# Patient Record
Sex: Male | Born: 1955
Health system: Southern US, Community
[De-identification: ages and names within clinical notes are randomized; demographics above are authoritative.]

## PROBLEM LIST (undated history)

## (undated) ENCOUNTER — Emergency Department (HOSPITAL_COMMUNITY): Discharge status: Against Medical Advice | Payer: Self-pay

## (undated) DIAGNOSIS — E119 Type 2 diabetes mellitus without complications: Secondary | ICD-10-CM

## (undated) DIAGNOSIS — I639 Cerebral infarction, unspecified: Secondary | ICD-10-CM

## (undated) DIAGNOSIS — E785 Hyperlipidemia, unspecified: Secondary | ICD-10-CM

## (undated) DIAGNOSIS — I10 Essential (primary) hypertension: Secondary | ICD-10-CM

## (undated) HISTORY — PX: OTHER SURGICAL HISTORY: SHX169

---

## 2013-01-05 ENCOUNTER — Observation Stay (HOSPITAL_COMMUNITY)
Admission: EM | Admit: 2013-01-05 | Discharge: 2013-01-06 | Disposition: A | Discharge status: Home / Self Care | Payer: Self-pay | Attending: Internal Medicine | Admitting: Internal Medicine

## 2013-01-05 ENCOUNTER — Encounter (HOSPITAL_COMMUNITY): Payer: Self-pay | Admitting: *Deleted

## 2013-01-05 ENCOUNTER — Observation Stay (HOSPITAL_COMMUNITY): Payer: Self-pay

## 2013-01-05 ENCOUNTER — Emergency Department (HOSPITAL_COMMUNITY): Payer: Self-pay

## 2013-01-05 DIAGNOSIS — Z720 Tobacco use: Secondary | ICD-10-CM | POA: Diagnosis present

## 2013-01-05 DIAGNOSIS — E785 Hyperlipidemia, unspecified: Secondary | ICD-10-CM | POA: Insufficient documentation

## 2013-01-05 DIAGNOSIS — Z9119 Patient's noncompliance with other medical treatment and regimen: Secondary | ICD-10-CM | POA: Insufficient documentation

## 2013-01-05 DIAGNOSIS — F17200 Nicotine dependence, unspecified, uncomplicated: Secondary | ICD-10-CM | POA: Insufficient documentation

## 2013-01-05 DIAGNOSIS — E119 Type 2 diabetes mellitus without complications: Secondary | ICD-10-CM | POA: Diagnosis present

## 2013-01-05 DIAGNOSIS — Z8673 Personal history of transient ischemic attack (TIA), and cerebral infarction without residual deficits: Secondary | ICD-10-CM | POA: Insufficient documentation

## 2013-01-05 DIAGNOSIS — R7309 Other abnormal glucose: Secondary | ICD-10-CM | POA: Insufficient documentation

## 2013-01-05 DIAGNOSIS — I10 Essential (primary) hypertension: Secondary | ICD-10-CM | POA: Diagnosis present

## 2013-01-05 DIAGNOSIS — Z91199 Patient's noncompliance with other medical treatment and regimen due to unspecified reason: Secondary | ICD-10-CM | POA: Insufficient documentation

## 2013-01-05 DIAGNOSIS — R29898 Other symptoms and signs involving the musculoskeletal system: Secondary | ICD-10-CM | POA: Insufficient documentation

## 2013-01-05 DIAGNOSIS — R42 Dizziness and giddiness: Secondary | ICD-10-CM | POA: Insufficient documentation

## 2013-01-05 DIAGNOSIS — G459 Transient cerebral ischemic attack, unspecified: Principal | ICD-10-CM | POA: Diagnosis present

## 2013-01-05 DIAGNOSIS — R209 Unspecified disturbances of skin sensation: Secondary | ICD-10-CM | POA: Insufficient documentation

## 2013-01-05 DIAGNOSIS — R4789 Other speech disturbances: Secondary | ICD-10-CM | POA: Insufficient documentation

## 2013-01-05 HISTORY — DX: Type 2 diabetes mellitus without complications: E11.9

## 2013-01-05 HISTORY — DX: Cerebral infarction, unspecified: I63.9

## 2013-01-05 HISTORY — DX: Essential (primary) hypertension: I10

## 2013-01-05 HISTORY — DX: Hyperlipidemia, unspecified: E78.5

## 2013-01-05 LAB — POCT I-STAT, CHEM 8
BUN: 11 mg/dL (ref 6–23)
Chloride: 107 mEq/L (ref 96–112)
Creatinine, Ser: 1 mg/dL (ref 0.50–1.35)
Glucose, Bld: 93 mg/dL (ref 70–99)
Potassium: 3.5 mEq/L (ref 3.5–5.1)
Sodium: 141 mEq/L (ref 135–145)

## 2013-01-05 LAB — POCT I-STAT TROPONIN I

## 2013-01-05 LAB — RAPID URINE DRUG SCREEN, HOSP PERFORMED
Barbiturates: NOT DETECTED
Cocaine: NOT DETECTED
Opiates: NOT DETECTED
Tetrahydrocannabinol: NOT DETECTED

## 2013-01-05 LAB — URINALYSIS, ROUTINE W REFLEX MICROSCOPIC
Bilirubin Urine: NEGATIVE
Hgb urine dipstick: NEGATIVE
Nitrite: NEGATIVE
Specific Gravity, Urine: 1.007 (ref 1.005–1.030)
Urobilinogen, UA: 0.2 mg/dL (ref 0.0–1.0)
pH: 7.5 (ref 5.0–8.0)

## 2013-01-05 LAB — CBC
HCT: 40.7 % (ref 39.0–52.0)
Hemoglobin: 14.7 g/dL (ref 13.0–17.0)
MCV: 85.1 fL (ref 78.0–100.0)
RBC: 4.78 MIL/uL (ref 4.22–5.81)
RDW: 13.5 % (ref 11.5–15.5)
WBC: 4.8 10*3/uL (ref 4.0–10.5)

## 2013-01-05 LAB — COMPREHENSIVE METABOLIC PANEL
AST: 22 U/L (ref 0–37)
CO2: 26 mEq/L (ref 19–32)
Calcium: 9.2 mg/dL (ref 8.4–10.5)
Chloride: 103 mEq/L (ref 96–112)
Creatinine, Ser: 0.97 mg/dL (ref 0.50–1.35)
GFR calc Af Amer: >90 mL/min (ref >90)
GFR calc non Af Amer: >90 mL/min (ref >90)
Glucose, Bld: 93 mg/dL (ref 70–99)
Total Bilirubin: 0.2 mg/dL — ABNORMAL LOW (ref 0.3–1.2)

## 2013-01-05 LAB — DIFFERENTIAL
Eosinophils Relative: 2 % (ref 0–5)
Lymphocytes Relative: 32 % (ref 12–46)
Lymphs Abs: 1.6 10*3/uL (ref 0.7–4.0)
Monocytes Absolute: 0.4 10*3/uL (ref 0.1–1.0)
Monocytes Relative: 9 % (ref 3–12)
Neutro Abs: 2.7 10*3/uL (ref 1.7–7.7)

## 2013-01-05 LAB — GLUCOSE, CAPILLARY: Glucose-Capillary: 98 mg/dL (ref 70–99)

## 2013-01-05 LAB — APTT: aPTT: 30 seconds (ref 24–37)

## 2013-01-05 LAB — ETHANOL: Alcohol, Ethyl (B): <11 mg/dL (ref 0–11)

## 2013-01-05 MED ORDER — ASPIRIN 325 MG PO TABS
325.0000 mg | ORAL_TABLET | Freq: Once | ORAL | Status: AC
  Administered 2013-01-05: 325 mg via ORAL
  Filled 2013-01-05: qty 1

## 2013-01-05 MED ORDER — ATORVASTATIN CALCIUM 80 MG PO TABS
80.0000 mg | ORAL_TABLET | Freq: Every day | ORAL | Status: DC
  Administered 2013-01-05: 80 mg via ORAL
  Filled 2013-01-05 (×2): qty 1

## 2013-01-05 MED ORDER — CLONIDINE HCL 0.3 MG PO TABS
0.3000 mg | ORAL_TABLET | Freq: Two times a day (BID) | ORAL | Status: DC
  Administered 2013-01-05: 0.3 mg via ORAL
  Filled 2013-01-05 (×3): qty 1

## 2013-01-05 MED ORDER — ASPIRIN 325 MG PO TABS
325.0000 mg | ORAL_TABLET | Freq: Every day | ORAL | Status: DC
  Filled 2013-01-05: qty 1

## 2013-01-05 MED ORDER — LORAZEPAM 2 MG/ML IJ SOLN
1.0000 mg | Freq: Once | INTRAMUSCULAR | Status: AC
  Administered 2013-01-05: 1 mg via INTRAVENOUS
  Filled 2013-01-05: qty 1

## 2013-01-05 NOTE — Consult Note (Signed)
Referring Physician: Dr. Bernette Mayers    Chief Complaint: slurred speech  HPI: Benjamin Clark is an 57 y.o. male who began having transient dizziness around 1100 on 01/05/2013. He talked with a co-worker who took him to the work nurse and BP was 180's/120s on multiple times. Patient had slurred speech and left hand tingling. Nurse told him to come to ED. Co-worker brought him. I talked to other staff at his job who were with him during the event to confirm the timing.  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 He says that he had no residuals. He has lived in various countries, is multi-lingual so he does have an accent but he does state that he feels that his speech is NOT normal.  He used to take aspirin but stopped years ago because he says that it irritated his stomach. He denies having an EGD. He thinks that it made him bleed. No bloody vomit, but he thought it was in his stool. He describes doing hemoccult cards and them being positive back then. At this time, he does not describe any type of workup.  Date last known well: 01/05/2013 Time last known well: 1130 NIHSS=2 (dysarthria, left hand)  tPA Given: No: symptoms minimal, felt resolving  Past Medical History  Diagnosis Date  . Hypertension   . Diabetes mellitus without complication   . Stroke     History reviewed. No pertinent past surgical history.  No family history on file. Social History:  reports that he has been smoking.  He does not have any smokeless tobacco history on file. He reports that  drinks alcohol. He reports that he does not use illicit drugs.  Allergies: No Known Allergies  Meds: he does take clonidine 0.3 mg daily, ace. He does not take aspirin   ROS: History obtained from the patient  General ROS: negative for - chills, fatigue, fever, night sweats, weight gain or weight loss Psychological ROS: negative for - behavioral disorder, hallucinations, memory difficulties, mood swings or  suicidal ideation Ophthalmic ROS: negative for - blurry vision, double vision, eye pain or loss of vision ENT ROS: negative for - epistaxis, nasal discharge, oral lesions, sore throat, tinnitus or vertigo Allergy and Immunology ROS: negative for - hives or itchy/watery eyes Hematological and Lymphatic ROS: negative for - bleeding problems, bruising or swollen lymph nodes Endocrine ROS: negative for - galactorrhea, hair pattern changes, polydipsia/polyuria or temperature intolerance Respiratory ROS: negative for - cough, hemoptysis, shortness of breath or wheezing Cardiovascular ROS: negative for - chest pain, dyspnea on exertion, edema or irregular heartbeat Gastrointestinal ROS: negative for - abdominal pain, diarrhea, hematemesis, nausea/vomiting or stool incontinence Genito-Urinary ROS: negative for - dysuria, hematuria, incontinence or urinary frequency/urgency Musculoskeletal ROS: negative for - joint swelling or muscular weakness Neurological ROS: as noted in HPI Dermatological ROS: negative for rash and skin lesion changes   Physical Examination: Blood pressure 161/101, pulse 65, temperature 98.4 F (36.9 C), temperature source Oral, resp. rate 18, SpO2 99.00%.  Neurologic Examination: Mental Status: Alert, oriented, thought content appropriate.  Speech fluent without evidence of aphasia but some dysarthria (french accent).  Able to follow 3 step commands without difficulty. Cranial Nerves: II: visual fields grossly normal, pupils equal, round, reactive to light and accommodation III,IV, VI: ptosis not present, extra-ocular motions intact bilaterally V,VII: smile symmetric, facial light touch sensation normal bilaterally. He does appear to have a slight left facial droop, but this does disappear with smile. VIII: hearing normal  bilaterally IX,X: gag reflex present XI: trapezius strength/neck flexion strength normal bilaterally XII: tongue strength normal  Motor: Right : Upper  extremity   5/5    Left:     Upper extremity   5/5  Lower extremity   5/5     Lower extremity   5/5 Tone and bulk:normal tone throughout; no atrophy noted Sensory: Pinprick and light touch intact throughout, bilaterally with exception of tingling sensation of left hand Plantars: Right: downgoing   Left: downgoing Cerebellar: normal heel-to-shin test, gait not tested due to patient safety.   Results for orders placed during the hospital encounter of 01/05/13 (from the past 48 hour(s))  GLUCOSE, CAPILLARY     Status: None   Collection Time    01/05/13  1:16 PM      Result Value Range   Glucose-Capillary 98  70 - 99 mg/dL   Comment 1 Documented in Chart     Comment 2 Notify RN    PROTIME-INR     Status: None   Collection Time    01/05/13  1:40 PM      Result Value Range   Prothrombin Time 14.9  11.6 - 15.2 seconds   INR 1.19  0.00 - 1.49  APTT     Status: None   Collection Time    01/05/13  1:40 PM      Result Value Range   aPTT 30  24 - 37 seconds  CBC     Status: Abnormal   Collection Time    01/05/13  1:40 PM      Result Value Range   WBC 4.8  4.0 - 10.5 K/uL   RBC 4.78  4.22 - 5.81 MIL/uL   Hemoglobin 14.7  13.0 - 17.0 g/dL   HCT 16.1  09.6 - 04.5 %   MCV 85.1  78.0 - 100.0 fL   MCH 30.8  26.0 - 34.0 pg   MCHC 36.1 (*) 30.0 - 36.0 g/dL   RDW 40.9  81.1 - 91.4 %   Platelets 255  150 - 400 K/uL  DIFFERENTIAL     Status: None   Collection Time    01/05/13  1:40 PM      Result Value Range   Neutrophils Relative 56  43 - 77 %   Neutro Abs 2.7  1.7 - 7.7 K/uL   Lymphocytes Relative 32  12 - 46 %   Lymphs Abs 1.6  0.7 - 4.0 K/uL   Monocytes Relative 9  3 - 12 %   Monocytes Absolute 0.4  0.1 - 1.0 K/uL   Eosinophils Relative 2  0 - 5 %   Eosinophils Absolute 0.1  0.0 - 0.7 K/uL   Basophils Relative 1  0 - 1 %   Basophils Absolute 0.0  0.0 - 0.1 K/uL  POCT I-STAT TROPONIN I     Status: None   Collection Time    01/05/13  1:50 PM      Result Value Range   Troponin i,  poc 0.00  0.00 - 0.08 ng/mL   Comment 3            Comment: Due to the release kinetics of cTnI,     a negative result within the first hours     of the onset of symptoms does not rule out     myocardial infarction with certainty.     If myocardial infarction is still suspected,     repeat the test at appropriate intervals.  POCT I-STAT, CHEM 8     Status: None   Collection Time    01/05/13  1:52 PM      Result Value Range   Sodium 141  135 - 145 mEq/L   Potassium 3.5  3.5 - 5.1 mEq/L   Chloride 107  96 - 112 mEq/L   BUN 11  6 - 23 mg/dL   Creatinine, Ser 1.91  0.50 - 1.35 mg/dL   Glucose, Bld 93  70 - 99 mg/dL   Calcium, Ion 4.78  2.95 - 1.23 mmol/L   TCO2 25  0 - 100 mmol/L   Hemoglobin 14.3  13.0 - 17.0 g/dL   HCT 62.1  30.8 - 65.7 %   Ct Head (brain) Wo Contrast 01/05/2013   No evidence of acute intracranial abnormality.  Probable chronic small vessel white matter ischemic changes or subacute - remote infarcts.  Critical Value/emergent results were called by telephone at the time of interpretation on 01/05/2013 at 1:35 p.m. to Dr. Sylvan Cheese, who verbally acknowledged these results.        Assessment: 57 y.o. male who presents with dysarthria, left hand tingling following episode of transient dizziness. MRI pending. Stroke workup.  Stroke Risk Factors - hyperlipidemia, hypertension, smoking and previous stroke  Plan: 1. HgbA1c, fasting lipid panel 2. MRI, MRA  of the brain without contrast 3. PT consult, OT consult, Speech consult 4. Echocardiogram 5. Carotid dopplers 6. Prophylactic therapy-Antiplatelet med: Aspirin - dose 325mg  daily. Hemoccult stools. 7. Risk factor modification 8. Telemetry monitoring 9. Frequent neuro checks   Guy Franco PA-C, MBA, MHA [facility] Pager [phone] 01/05/2013, 2:22 PM  I personally participated in this patient's evaluation and management and approved the above clinical assessment and treatment recommendations.  Venetia Maxon M.D.  [facility]  [phone]

## 2013-01-05 NOTE — ED Notes (Signed)
Pt states that he was normal when he woke up and about 1100 became dizzy and developed slurred speech.  No diabetes.  ? Right facial droop.  Tingling in right fingers no chest pain.

## 2013-01-05 NOTE — ED Notes (Signed)
CBG was 98. Notified Nurse Sabrina. 

## 2013-01-05 NOTE — ED Notes (Signed)
Vitals charted at 1442, were actually vitals for 1430.
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
## 2013-01-05 NOTE — ED Notes (Signed)
Patient transported to MRI 

## 2013-01-05 NOTE — Progress Notes (Signed)
Pt in from ED awake .alert and oriented x 4 no neuro deficit noted at this time.initial assessment done . No c/o at this time  NSR  on  cardiac monitor .No distress at this time.

## 2013-01-05 NOTE — ED Provider Notes (Signed)
History     CSN: [id number]  Arrival date & time 01/05/13  1259   First MD Initiated Contact with Patient 01/05/13 1317      Chief Complaint  Patient presents with  . Stroke Symptoms    (Consider location/radiation/quality/duration/timing/severity/associated sxs/prior treatment) HPI Level 5 caveat due to acuity Pt brought to ED by coworker after onset of stroke-like symptoms a short time ago. Code Stroke called on arrival and Stroke Team at bedside on patient's arrival to room from CT. They have been able to establish LSN from coworkers of around 11:30-11:45am when he apparently reported feeling dizzy with slurred speech. Nurse at his workplace found elevated BP and advised transport to the ED. He is feeling better now. Has history of stroke but not taking ASA due to intolerance.   Past Medical History  Diagnosis Date  . Hypertension   . Diabetes mellitus without complication   . Stroke     History reviewed. No pertinent past surgical history.  No family history on file.  History  Substance Use Topics  . Smoking status: Current Every Day Smoker  . Smokeless tobacco: Not on file  . Alcohol Use: Yes     Comment: occ      Review of Systems Unable to assess due to mental status.   Allergies  Review of patient's allergies indicates no known allergies.  Home Medications  No current outpatient prescriptions on file.  BP 167/108  Pulse 71  Temp(Src) 98.3 F (36.8 C) (Oral)  Resp 18  SpO2 98%  Physical Exam  Nursing note and vitals reviewed. Constitutional: He is oriented to person, place, and time. He appears well-developed and well-nourished.  HENT:  Head: Normocephalic and atraumatic.  Eyes: EOM are normal. Pupils are equal, round, and reactive to light.  Neck: Normal range of motion. Neck supple.  Cardiovascular: Normal rate, normal heart sounds and intact distal pulses.   Pulmonary/Chest: Effort normal and breath sounds normal.  Abdominal: Bowel sounds are  normal. He exhibits no distension. There is no tenderness.  Musculoskeletal: Normal range of motion. He exhibits no edema and no tenderness.  Neurological: He is alert and oriented to person, place, and time. He has normal strength. A cranial nerve deficit (slight flattening of the L nasolabial fold) is present. No sensory deficit. Coordination normal.  For full NIHSS please see the Code Stroke team documentation  Skin: Skin is warm and dry. No rash noted.  Psychiatric: He has a normal mood and affect.    ED Course  Procedures (including critical care time)  Labs Reviewed  GLUCOSE, CAPILLARY  ETHANOL  PROTIME-INR  APTT  CBC  DIFFERENTIAL  COMPREHENSIVE METABOLIC PANEL  TROPONIN I  URINE RAPID DRUG SCREEN (HOSP PERFORMED)  URINALYSIS, ROUTINE W REFLEX MICROSCOPIC   Ct Head (brain) Wo Contrast  01/05/2013  *RADIOLOGY REPORT*  Clinical Data: 57 year old male with slurred speech and left hand tingling.  CT HEAD WITHOUT CONTRAST  Technique:  Contiguous axial images were obtained from the base of the skull through the vertex without contrast.  Comparison: None  Findings: Periventricular white matter hypodensities likely represent chronic small vessel white matter ischemic changes or possibly subacute to remote infarcts.  No acute intracranial abnormalities are identified, including mass lesion or mass effect, hydrocephalus, extra-axial fluid collection, midline shift, hemorrhage, or acute infarction.  The visualized bony calvarium is unremarkable.  IMPRESSION: No evidence of acute intracranial abnormality.  Probable chronic small vessel white matter ischemic changes or subacute - remote infarcts.  Critical Value/emergent  results were called by telephone at the time of interpretation on 01/05/2013 at 1:35 p.m. to Dr. Sylvan Cheese, who verbally acknowledged these results.   Original Report Authenticated By: Harmon Pier, M.D.      1. TIA (transient ischemic attack)       MDM  Per Stroke Team,  NIHSS is 1, not a candidate for tPA due to low score and improving symptoms. Recommend admission for further eval, including MRI/MRA.    Date: 01/05/2013  Rate: 65  Rhythm: normal sinus rhythm  QRS Axis: normal  Intervals: normal  ST/T Wave abnormalities: normal  Conduction Disutrbances:none  Narrative Interpretation: LVH  Old EKG Reviewed: none available       Charles B. Bernette Mayers, MD 01/05/13 1453

## 2013-01-06 ENCOUNTER — Encounter (HOSPITAL_COMMUNITY): Payer: Self-pay | Admitting: Internal Medicine

## 2013-01-06 DIAGNOSIS — I369 Nonrheumatic tricuspid valve disorder, unspecified: Secondary | ICD-10-CM

## 2013-01-06 LAB — GLUCOSE, CAPILLARY: Glucose-Capillary: 83 mg/dL (ref 70–99)

## 2013-01-06 LAB — HEMOGLOBIN A1C
Hgb A1c MFr Bld: 5.8 % — ABNORMAL HIGH (ref <5.7)
Mean Plasma Glucose: 120 mg/dL — ABNORMAL HIGH (ref <117)

## 2013-01-06 LAB — LIPID PANEL
Cholesterol: 160 mg/dL (ref 0–200)
Total CHOL/HDL Ratio: 3.3 RATIO

## 2013-01-06 MED ORDER — CLONIDINE HCL 0.2 MG PO TABS
0.2000 mg | ORAL_TABLET | Freq: Two times a day (BID) | ORAL | Status: DC
  Filled 2013-01-06 (×2): qty 1

## 2013-01-06 MED ORDER — CLOPIDOGREL BISULFATE 75 MG PO TABS: 75.0000 mg | ORAL_TABLET | Freq: Every day | ORAL | Status: DC

## 2013-01-06 MED ORDER — ASPIRIN EC 325 MG PO TBEC: 325.0000 mg | DELAYED_RELEASE_TABLET | Freq: Every day | ORAL | Status: DC

## 2013-01-06 MED ORDER — ATORVASTATIN CALCIUM 40 MG PO TABS: 40.0000 mg | ORAL_TABLET | Freq: Every day | ORAL | Status: DC

## 2013-01-06 MED ORDER — ASPIRIN 325 MG PO TABS: 325.0000 mg | ORAL_TABLET | Freq: Every day | ORAL | Status: DC

## 2013-01-06 MED ORDER — CLOPIDOGREL BISULFATE 75 MG PO TABS
75.0000 mg | ORAL_TABLET | Freq: Every day | ORAL | Status: DC
  Administered 2013-01-06: 75 mg via ORAL
  Filled 2013-01-06: qty 1

## 2013-01-06 NOTE — Plan of Care (Signed)
Problem: Phase III Progression Outcomes Goal: Discharge plan remains appropriate-arrangements made No follow up OT recommended at this time

## 2013-01-06 NOTE — Evaluation (Signed)
Occupational Therapy Evaluation Patient Details Name: Benjamin Clark MRN: [id number] DOB: [birth year] Today's Date: 01/06/2013 Time:  -     OT Assessment / Plan / Recommendation Clinical Impression  Pt doing well following episode stroke like symptoms which have now resolved completely. Pt is independent with all ADLs and ADL mobility and no further acute or follow up OT services required at this time, OT will sign off    OT Assessment  Patient does not need any further OT services    Follow Up Recommendations  No OT follow up    Barriers to Discharge  None    Equipment Recommendations  None recommended by OT    Recommendations for Other Services  None  Frequency       Precautions / Restrictions Precautions Precautions: None Restrictions Weight Bearing Restrictions: No       ADL  Grooming: Performed;Wash/dry hands;Wash/dry face;Independent Where Assessed - Grooming: Unsupported standing Upper Body Bathing: Simulated;Independent Lower Body Bathing: Simulated;Independent Upper Body Dressing: Performed;Independent Lower Body Dressing: Performed;Independent Toilet Transfer: Performed;Independent Toilet Transfer Method: Sit to Barista: Regular height toilet Toileting - Clothing Manipulation and Hygiene: Independent;Performed Tub/Shower Transfer: Performed;Independent Tub/Shower Transfer Method: Science writer: Walk in shower    OT Diagnosis:    OT Problem List:   OT Treatment Interventions:     OT Goals    Visit Information  Last OT Received On: 01/06/13 Assistance Needed: +1    Subjective Data  Subjective: " I am ready to go " Patient Stated Goal: To return to normal routine   Prior Functioning     Home Living Lives With: Alone Type of Home: Other (Comment) (hotel(temp housing)) Home Access: Level entry Home Layout: One level Bathroom Shower/Tub: Engineer, manufacturing systems: Standard Home Adaptive  Equipment: None Additional Comments: pt relatively new to job and area (from [place]) and has been staying at hotel until permanent housing arranged Prior Function Level of Independence: Independent Able to Take Stairs?: Yes Driving: Yes Vocation: Full time employment 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 Dominant Hand: Right         Vision/Perception Vision - History Baseline Vision: Wears glasses all the time Patient Visual Report: No change from baseline   Cognition  Cognition Arousal/Alertness: Awake/alert Behavior During Therapy: WFL for tasks assessed/performed Overall Cognitive Status: Within Functional Limits for tasks assessed    Extremity/Trunk Assessment Right Upper Extremity Assessment RUE ROM/Strength/Tone: Within functional levels RUE Sensation: WFL - Light Touch;WFL - Proprioception RUE Coordination: WFL - gross/fine motor Left Upper Extremity Assessment LUE ROM/Strength/Tone: Within functional levels LUE Sensation: WFL - Light Touch;WFL - Proprioception LUE Coordination: WFL - gross/fine motor  Mobility Bed Mobility Bed Mobility: Supine to Sit Supine to Sit: 7: Independent Transfers Transfers: Sit to Stand;Stand to Sit Sit to Stand: 7: Independent Stand to Sit: 7: Independent     Exercise     Balance Balance Balance Assessed: Yes Dynamic Standing Balance Dynamic Standing - Balance Support: No upper extremity supported;During functional activity Dynamic Standing - Level of Assistance: 7: Independent    End of Session OT - End of Session Activity Tolerance: Patient tolerated treatment well Patient left: in bed;with call bell/phone within reach  GO Functional Limitation: Self care Self Care Current Status (W0981): 0 percent impaired, limited or restricted Self Care Goal Status (X9147): 0 percent impaired, limited or restricted Self Care Discharge Status (W2956): 0 percent impaired, limited or restricted    Galen Manila 01/06/2013, 12:00 PM

## 2013-01-06 NOTE — Progress Notes (Signed)
  Echocardiogram 2D Echocardiogram has been performed.  Fawzi Melman FRANCES 01/06/2013, 11:37 AM

## 2013-01-06 NOTE — Progress Notes (Signed)
Stroke Team Progress Note  HISTORY Benjamin Clark is an 57 y.o. male who began having transient dizziness around 1100 on 01/05/2013. He talked with a co-worker who took him to the work nurse and BP was 180's/120s on multiple times. Patient had slurred speech and left hand tingling. Nurse told him to come to ED. Co-worker brought him. I talked to other staff at his job who were with him during the event to confirm the timing.  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 He says that he had no residuals. He has lived in various countries, is multi-lingual so he does have an accent but he does state that he feels that his speech is NOT normal.   He used to take aspirin but stopped years ago because he says that it irritated his stomach. He denies having an EGD. He thinks that it made him bleed. No bloody vomit, but he thought it was in his stool. He describes doing hemoccult cards and them being positive back then. At this time, he does not describe any type of workup.  Patient was not a TPA candidate secondary to sx resolving. He was admitted for further evaluation and treatment.  SUBJECTIVE No family is at the bedside.  Overall he feels his condition is completely resolved. 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 Just resumed smoking 6 weeks ago. Living in a hotel, eating out at night and drinking cocktails. States he has a stressful job,  OBJECTIVE Most recent Vital Signs: Filed Vitals:   01/05/13 2200 01/06/13 0217 01/06/13 0615 01/06/13 0941  BP: 148/96 123/86 117/81 153/98  Pulse: 63 59 58 63  Temp: 97.6 F (36.4 C) 97.7 F (36.5 C) 97.5 F (36.4 C) 97.4 F (36.3 C)  TempSrc: Oral Oral Oral Oral  Resp:  17 17 18   Height:      Weight:      SpO2: 97% 99% 100% 100%   CBG (last 3)   Recent Labs  01/05/13 1316  GLUCAP 98   IV Fluid Intake:     MEDICATIONS  . aspirin  325 mg Oral Daily  .  atorvastatin  80 mg Oral q1800  . cloNIDine  0.2 mg Oral BID   PRN:    Diet:  Cardiac thin liquids Activity:  Up with assistance DVT Prophylaxis:  none  CLINICALLY SIGNIFICANT STUDIES Basic Metabolic Panel:  Recent Labs Lab 01/05/13 1340 01/05/13 1352  NA 139 141  K 3.5 3.5  CL 103 107  CO2 26  --   GLUCOSE 93 93  BUN 11 11  CREATININE 0.97 1.00  CALCIUM 9.2  --    Liver Function Tests:  Recent Labs Lab 01/05/13 1340  AST 22  ALT 19  ALKPHOS 46  BILITOT 0.2*  PROT 7.6  ALBUMIN 4.1   CBC:  Recent Labs Lab 01/05/13 1340 01/05/13 1352  WBC 4.8  --   NEUTROABS 2.7  --   HGB 14.7 14.3  HCT 40.7 42.0  MCV 85.1  --   PLT 255  --    Coagulation:  Recent Labs Lab 01/05/13 1340  LABPROT 14.9  INR 1.19   Cardiac Enzymes:  Recent Labs Lab 01/05/13 1329  TROPONINI <0.30   Urinalysis:  Recent Labs Lab 01/05/13 1409  COLORURINE YELLOW  LABSPEC 1.007  PHURINE 7.5  GLUCOSEU NEGATIVE  HGBUR NEGATIVE  BILIRUBINUR NEGATIVE  KETONESUR NEGATIVE  PROTEINUR  NEGATIVE  UROBILINOGEN 0.2  NITRITE NEGATIVE  LEUKOCYTESUR NEGATIVE   Lipid Panel    Component Value Date/Time   CHOL 160 01/06/2013 0500   TRIG 137 01/06/2013 0500   HDL 48 01/06/2013 0500   CHOLHDL 3.3 01/06/2013 0500   VLDL 27 01/06/2013 0500   LDLCALC 85 01/06/2013 0500   HgbA1C  No results found for this basename: HGBA1C    Urine Drug Screen:     Component Value Date/Time   LABOPIA NONE DETECTED 01/05/2013 1409   COCAINSCRNUR NONE DETECTED 01/05/2013 1409   LABBENZ NONE DETECTED 01/05/2013 1409   AMPHETMU NONE DETECTED 01/05/2013 1409   THCU NONE DETECTED 01/05/2013 1409   LABBARB NONE DETECTED 01/05/2013 1409    Alcohol Level:  Recent Labs Lab 01/05/13 1340  ETH <11   CT of the brain  01/05/2013   No evidence of acute intracranial abnormality.  Probable chronic small vessel white matter ischemic changes or subacute - remote infarcts.     MRI of the brain  01/05/2013 Extensive chronic microvascular  ischemic change.  Mild premature atrophy.  No acute intracranial findings.   MRA of the brain  01/05/2013   Potentially flow reducing stenoses of right and left MCA branches at their proximal M2 segments. No proximal flow reducing lesion of the carotid or basilar arteries.    2D Echocardiogram  EF 55-60% with no source of embolus.   Carotid Doppler  No evidence of hemodynamically significant internal carotid artery stenosis. Vertebral artery flow is antegrade.   CXR    EKG  normal sinus rhythm.   Therapy Recommendations none  Physical Exam   Pleasant middle aged african [place] male not in distress.Awake alert. Afebrile. Head is nontraumatic. Neck is supple without bruit. Hearing is normal. Cardiac exam no murmur or gallop. Lungs are clear to auscultation. Distal pulses are well felt. Neurological Exam ;  Awake  Alert oriented x 3. Normal speech and language.eye movements full without nystagmus.fundi were not visualized. Vision acuity and fields appear normal. Hearing is normal. Palatal movements are normal. Face symmetric. Tongue midline. Normal strength, tone, reflexes and coordination. Normal sensation. Gait deferred. ASSESSMENT Mr. Benjamin Clark is a 57 y.o. male presenting with slurred speech and left hand tingling in setting of malignant hypertension with BP 180's/120s.  Imaging confirms no acute stroke. Dx: right brain TIA. 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 On no antiplatelets prior to admission. Now on aspirin 325 mg orally every day for secondary stroke prevention. Patient with no resultant neuro deficits. Work up completed.   Hx 2 previous strokes per pt Hyperlipidemia, LDL 85, on pravastatin 40 PTA, changed to lipitor 40 , at goal LDL < 100 Malignant hypertension Cigarette smoker  Intracranial atheroscelosis  Hospital day # 1  TREATMENT/PLAN  As patient not on antiplatelets prior to admission, appropriate first line treatment is aspirin. Patient  did report GI issues with aspirin in past. Dr. Pearlean Brownie recommended change to Plavix during rounds. It is ok to discharge on aspirin 325 mg daily if patient is agreeable for secondary stroke prevention.  Ok for discharge from stroke standpoint Patient has a 10-15% risk of having another stroke over the next year, the highest risk is within 2 weeks of the most recent stroke/TIA (risk of having a stroke following a stroke or TIA is the same). Ongoing risk factor control by Primary Care Physician - stop smoking, decrease stress, medication compliance Follow up with Dr. Pearlean Brownie, Stroke Clinic, in 2 months.  SHARON  BIBY, MSN, RN, ANVP-BC, ANP-BC, [facility] Pager: [phone] 01/06/2013 10:59 AM  I have personally obtained a history, examined the patient, evaluated imaging results, and formulated the assessment and plan of care. I agree with the above. Delia Heady, MD
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
## 2013-01-06 NOTE — Progress Notes (Signed)
Physical Therapy Evaluation Patient Details Name: Benjamin Clark MRN: [id number] DOB: 1956/05/22 Today's Date: 01/06/2013 Time: 7829-5621 PT Time Calculation (min): 16 min  PT Assessment / Plan / Recommendation Clinical Impression  Pt is 57 yo male w/p stroke like symptoms which have now resolved completely and he is independent and safe with mobility. No acute or f/u PT needs.    PT Assessment  Patent does not need any further PT services    Follow Up Recommendations  No PT follow up    Does the patient have the potential to tolerate intense rehabilitation      Barriers to Discharge        Equipment Recommendations  None recommended by PT    Recommendations for Other Services     Frequency      Precautions / Restrictions Precautions Precautions: None Restrictions Weight Bearing Restrictions: No   Pertinent Vitals/Pain VSS      Mobility  Bed Mobility Bed Mobility: Supine to Sit Supine to Sit: 7: Independent Transfers Transfers: Sit to Stand;Stand to Sit Sit to Stand: 7: Independent Stand to Sit: 7: Independent Ambulation/Gait Ambulation/Gait Assistance: 7: Independent Ambulation Distance (Feet): 400 Feet Assistive device: None Ambulation/Gait Assistance Details: pt ambulates with slight antalgia due to left knee OA but this is normal for him Gait Pattern: Within Functional Limits Gait velocity: WFL Stairs: Yes Stairs Assistance: 7: Independent Stair Management Technique: No rails;Forwards;Alternating pattern Number of Stairs: 12 Wheelchair Mobility Wheelchair Mobility: No Modified Rankin (Stroke Patients Only) Pre-Morbid Rankin Score: No symptoms Modified Rankin: No symptoms    Exercises     PT Diagnosis:    PT Problem List:   PT Treatment Interventions:     PT Goals    Visit Information  Last PT Received On: 01/06/13 Assistance Needed: +1 PT/OT Co-Evaluation/Treatment: Yes    Subjective Data  Subjective: pt feels back to himself Patient  Stated Goal: return to work   Prior Functioning  Home Living Lives With: Alone Type of Home: Other (Comment) (extended stay hotel) Home Access: Level entry Home Layout: One level Home Adaptive Equipment: None Additional Comments: pt relatively new to job and area (from [place]) and has been staying at hotel until permanent housing arranged Prior Function Level of Independence: Independent Able to Take Stairs?: Yes Driving: Yes Vocation: Full time employment Comments: Physiological scientist for [employer] Communication: No difficulties Dominant Hand: Right    Cognition  Cognition Arousal/Alertness: Awake/alert Behavior During Therapy: WFL for tasks assessed/performed Overall Cognitive Status: Within Functional Limits for tasks assessed (pt gets worked up very easily about insignificant things)    Extremity/Trunk Assessment Right Upper Extremity Assessment RUE ROM/Strength/Tone: [facility] for tasks assessed Left Upper Extremity Assessment LUE ROM/Strength/Tone: WFL for tasks assessed Right Lower Extremity Assessment RLE ROM/Strength/Tone: Within functional levels RLE Sensation: WFL - Light Touch;WFL - Proprioception RLE Coordination: WFL - gross/fine motor Left Lower Extremity Assessment LLE ROM/Strength/Tone: Within functional levels LLE Sensation: WFL - Light Touch;WFL - Proprioception LLE Coordination: WFL - gross/fine motor Trunk Assessment Trunk Assessment: Normal   Balance Balance Balance Assessed: Yes Dynamic Standing Balance Dynamic Standing - Balance Support: No upper extremity supported;During functional activity Dynamic Standing - Level of Assistance: 7: Independent Dynamic Standing - Comments: eyes closed, [place]omberg, SL stance with no LOB, mod I  End of Session PT - End of Session Activity Tolerance: Patient tolerated treatment well Patient left: Other (comment) (with OT) Nurse Communication: Mobility status  GP Functional Assessment Tool Used: clinical  judgement Functional Limitation: Mobility: Walking and moving  around Mobility: Walking and Moving Around Current Status [phone]): 0 percent impaired, limited or restricted Mobility: Walking and Moving Around Goal Status [phone]): 0 percent impaired, limited or restricted Mobility: Walking and Moving Around Discharge Status [phone]): 0 percent impaired, limited or restricted  Lyanne Co, PT  Acute Rehab Services  [phone]  Lyanne Co 01/06/2013, 11:15 AM

## 2013-01-06 NOTE — Progress Notes (Signed)
VASCULAR LAB PRELIMINARY  PRELIMINARY  PRELIMINARY  PRELIMINARY  Carotid duplex  completed.    Preliminary report:  Bilateral:  No evidence of hemodynamically significant internal carotid artery stenosis.   Vertebral artery flow is antegrade.      Gussie Murton, RVT 01/06/2013, 10:17 AM

## 2013-01-06 NOTE — H&P (Signed)
Internal Medicine Attending Admission Note Date: 01/06/2013  Patient name: Benjamin Clark Medical record number: [id number] Date of birth: 12-16-1955 Age: 57 y.o. Gender: male  I saw and evaluated the patient. I reviewed the resident's note and I agree with the resident's findings and plan as documented in the resident's note.  Chief Complaint(s):  Dizziness, left arm numbness and tingling  History - key components related to admission: Mr. Fung is a 57 year old man with past medical history most significant for hypertension, borderline diabetes, hyperlipidemia and previous stroke(unconfirmed) comes in with chief complaints of dizziness and weakness and numbness in the left hand. Patient started working 8 weeks ago and has been really stressed out recently. Patient's blood pressure was checked at work and was found to be 170/120 after which it was advised to take him to the emergency room. Patient denies any complaints today to me and thinks that he has turned 180 from what he was yesterday.  The patient has been taking his blood pressure medications. He has been noncompliant with aspirin. He denies any complaints to me at this time. Patient also complains of bitemporal headache and slurred speech which have completely disappeared now.  15 point review of system was negative except for as noted above.  Past medical history, past surgical history, medications, allergies, social history and family history was reviewed as per resident's note.  Physical Exam - key components related to admission:  Filed Vitals:   01/05/13 2200 01/06/13 0217 01/06/13 0615 01/06/13 0941  BP: 148/96 123/86 117/81 153/98  Pulse: 63 59 58 63  Temp: 97.6 F (36.4 C) 97.7 F (36.5 C) 97.5 F (36.4 C) 97.4 F (36.3 C)  TempSrc: Oral Oral Oral Oral  Resp:  17 17 18   Height:      Weight:      SpO2: 97% 99% 100% 100%  Physical Exam: General: Vital signs reviewed and noted. Well-developed, well-nourished, in no acute  distress; alert, appropriate and cooperative throughout examination.  Head: Normocephalic, atraumatic.  Eyes: PERRL, EOMI, No signs of anemia or jaundince.  Nose: Mucous membranes moist, not inflammed, nonerythematous.  Throat: Oropharynx nonerythematous, no exudate appreciated.   Neck: No deformities, masses, or tenderness noted.Supple, No carotid Bruits, no JVD.  Lungs:  Normal respiratory effort. Clear to auscultation BL without crackles or wheezes.  Heart: RRR. S1 and S2 normal without gallop, murmur, or rubs.  Abdomen:  BS normoactive. Soft, Nondistended, non-tender.  No masses or organomegaly.  Extremities: No pretibial edema.  Neurologic: A&O X3, CN II - XII are grossly intact. Motor strength is 5/5 in the all 4 extremities, Sensations intact to light touch, Cerebellar signs negative.  Skin: No visible rashes, scars.    Lab results:   Basic Metabolic Panel:  Recent Labs  21/30/86 1340 01/05/13 1352  NA 139 141  K 3.5 3.5  CL 103 107  CO2 26  --   GLUCOSE 93 93  BUN 11 11  CREATININE 0.97 1.00  CALCIUM 9.2  --    Liver Function Tests:  Recent Labs  01/05/13 1340  AST 22  ALT 19  ALKPHOS 46  BILITOT 0.2*  PROT 7.6  ALBUMIN 4.1   CBC:  Recent Labs  01/05/13 1340 01/05/13 1352  WBC 4.8  --   NEUTROABS 2.7  --   HGB 14.7 14.3  HCT 40.7 42.0  MCV 85.1  --   PLT 255  --    Cardiac Enzymes:  Recent Labs  01/05/13 1329  TROPONINI <0.30  CBG:  Recent Labs  01/05/13 1316  GLUCAP 98   Fasting Lipid Panel:  Recent Labs  01/06/13 0500  CHOL 160  HDL 48  LDLCALC 85  TRIG 137  CHOLHDL 3.3   Coagulation:  Recent Labs  01/05/13 1340  INR 1.19    Alcohol Level:  Recent Labs  01/05/13 1340  ETH <11   Imaging results:  Ct Head (brain) Wo Contrast  01/05/2013  *RADIOLOGY REPORT*  Clinical Data: 57 year old male with slurred speech and left hand tingling.  CT HEAD WITHOUT CONTRAST  Technique:  Contiguous axial images were obtained  from the base of the skull through the vertex without contrast.  Comparison: None  Findings: Periventricular white matter hypodensities likely represent chronic small vessel white matter ischemic changes or possibly subacute to remote infarcts.  No acute intracranial abnormalities are identified, including mass lesion or mass effect, hydrocephalus, extra-axial fluid collection, midline shift, hemorrhage, or acute infarction.  The visualized bony calvarium is unremarkable.  IMPRESSION: No evidence of acute intracranial abnormality.  Probable chronic small vessel white matter ischemic changes or subacute - remote infarcts.  Critical Value/emergent results were called by telephone at the time of interpretation on 01/05/2013 at 1:35 p.m. to Dr. Sylvan Cheese, who verbally acknowledged these results.   Original Report Authenticated By: Harmon Pier, M.D.    Mr Angiogram Head Wo Contrast  01/05/2013  *RADIOLOGY REPORT*  Clinical Data:  Lightheadedness with vertigo, left arm numbness and tingling with slurred speech. Risk factors for stroke including prior stroke, hypertension, diabetes mellitus, and tobacco abuse.  MRI HEAD WITHOUT CONTRAST MRA HEAD WITHOUT CONTRAST  Technique:  Multiplanar, multiecho pulse sequences of the brain and surrounding structures were obtained without intravenous contrast. Angiographic images of the head were obtained using MRA technique without contrast.  Comparison:   01/05/2013 CT head.  MRI HEAD  Findings:  There is no evidence for acute infarction, intracranial hemorrhage, mass lesion, hydrocephalus, or extra-axial fluid. There is mild premature atrophy.  There is extensive subcortical and periventricular white matter signal abnormality likely representing chronic microvascular ischemic change.  No foci of chronic hemorrhage.  Flow voids are maintained in the major intracranial vessels.  The calvarium is intact.  Pituitary and cerebellar tonsils are unremarkable.  No acute sinus or mastoid  disease.  Negative orbits.  IMPRESSION: Extensive chronic microvascular ischemic change.  Mild premature atrophy.  No acute intracranial findings.  MRA HEAD  Findings: The internal carotid arteries show mild irregularity in the supraclinoid segments.  The basilar artery is mildly irregular without focal narrowing.  Moderate irregularity A1 segment right anterior cerebral artery. The left anterior cerebral artery widely patent.  No M1 MCA segment disease.  Potentially flow limiting stenosis right posterior temporal branch and left anterior temporal branch proximal M2 MCA segments.  Moderate irregularity distal MCA and PCA branches.  Vertebrals codominant.  No PCA stenosis.  No cerebellar branch occlusion.  No intracranial aneurysm.  IMPRESSION: Potentially flow reducing stenoses of right and left MCA branches at their proximal M2 segments. No proximal flow reducing lesion of the carotid or basilar arteries.   Original Report Authenticated By: Davonna Belling, M.D.    Mr Brain Wo Contrast  01/05/2013  *RADIOLOGY REPORT*  Clinical Data:  Lightheadedness with vertigo, left arm numbness and tingling with slurred speech. Risk factors for stroke including prior stroke, hypertension, diabetes mellitus, and tobacco abuse.  MRI HEAD WITHOUT CONTRAST MRA HEAD WITHOUT CONTRAST  Technique:  Multiplanar, multiecho pulse sequences of the brain and surrounding structures were  obtained without intravenous contrast. Angiographic images of the head were obtained using MRA technique without contrast.  Comparison:   01/05/2013 CT head.  MRI HEAD  Findings:  There is no evidence for acute infarction, intracranial hemorrhage, mass lesion, hydrocephalus, or extra-axial fluid. There is mild premature atrophy.  There is extensive subcortical and periventricular white matter signal abnormality likely representing chronic microvascular ischemic change.  No foci of chronic hemorrhage.  Flow voids are maintained in the major intracranial vessels.   The calvarium is intact.  Pituitary and cerebellar tonsils are unremarkable.  No acute sinus or mastoid disease.  Negative orbits.  IMPRESSION: Extensive chronic microvascular ischemic change.  Mild premature atrophy.  No acute intracranial findings.  MRA HEAD  Findings: The internal carotid arteries show mild irregularity in the supraclinoid segments.  The basilar artery is mildly irregular without focal narrowing.  Moderate irregularity A1 segment right anterior cerebral artery. The left anterior cerebral artery widely patent.  No M1 MCA segment disease.  Potentially flow limiting stenosis right posterior temporal branch and left anterior temporal branch proximal M2 MCA segments.  Moderate irregularity distal MCA and PCA branches.  Vertebrals codominant.  No PCA stenosis.  No cerebellar branch occlusion.  No intracranial aneurysm.  IMPRESSION: Potentially flow reducing stenoses of right and left MCA branches at their proximal M2 segments. No proximal flow reducing lesion of the carotid or basilar arteries.   Original Report Authenticated By: Davonna Belling, M.D.     Other results: EKG: 65 beats per minute, normal axis, sinus rhythm, no ST or T wave changes noted, Left frontal hypertrophy but otherwise her normal EKG  Assessment & Plan by Problem:  Principal Problem:   TIA (transient ischemic attack) Active Problems:   Hypertension   Diabetes mellitus   Tobacco abuse  Patient is a 57 year old man with past medical history of hypertension, hyperlipidemia and previous stroke who comes in with chief complaints of left arm weakness and numbness, slurring of speech yesterday which have completely disappeared now. Signs and symptoms are concerning for transient ischemic attack. Patient's symptoms have completely disappeared. Initial workup has been negative.  Patient will be discharged home today with advice to start aspirin 325 mg.  Patient was advised to followup with his primary care physician.  Lars Mage MD Faculty-Internal Medicine Residency Program Pager [phone]

## 2013-01-06 NOTE — Progress Notes (Addendum)
Subjective: symptoms of slurred speech, lightheadedness, h/a, left hand numbness tingling have resolved. Denies problems with ambulation. He is ready to go home.  He reports a previous stroke in 2005 in [place].  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    Objective: Vital signs in last 24 hours: Filed Vitals:   01/05/13 1830 01/05/13 2200 01/06/13 0217 01/06/13 0615  BP: 150/100 148/96 123/86 117/81  Pulse: 70 63 59 58  Temp:  97.6 F (36.4 C) 97.7 F (36.5 C) 97.5 F (36.4 C)  TempSrc:  Oral Oral Oral  Resp:   17 17  Height:      Weight:      SpO2:  97% 99% 100%   Weight change:  No intake or output data in the 24 hours ending 01/06/13 0913 Vitals reviewed. General: resting in bed, NAD, alert and oriented x 3 HEENT: [place]/at, no scleral icterus Cardiac: RRR, no rubs, murmurs or gallops Pulm: clear to auscultation bilaterally, no wheezes, rales, or rhonchi Abd: soft, nontender, nondistended, BS present Ext: warm and well perfused, no pedal edema Neuro: alert and oriented X3, CN 2-12 intact, 5/5 strength upper and lower extremities b/l, sensation intact  Lab Results: Basic Metabolic Panel:  Recent Labs Lab 01/05/13 1340 01/05/13 1352  NA 139 141  K 3.5 3.5  CL 103 107  CO2 26  --   GLUCOSE 93 93  BUN 11 11  CREATININE 0.97 1.00  CALCIUM 9.2  --    Liver Function Tests:  Recent Labs Lab 01/05/13 1340  AST 22  ALT 19  ALKPHOS 46  BILITOT 0.2*  PROT 7.6  ALBUMIN 4.1   CBC:  Recent Labs Lab 01/05/13 1340 01/05/13 1352  WBC 4.8  --   NEUTROABS 2.7  --   HGB 14.7 14.3  HCT 40.7 42.0  MCV 85.1  --   PLT 255  --    Cardiac Enzymes:  Recent Labs Lab 01/05/13 1329  TROPONINI <0.30   CBG:  Recent Labs Lab 01/05/13 1316  GLUCAP 98   Hemoglobin A1C: No results found for this basename: HGBA1C,  in the last 168 hours Fasting Lipid Panel:  Recent Labs Lab 01/06/13 0500  CHOL 160  HDL 48   LDLCALC 85  TRIG 045  CHOLHDL 3.3   Coagulation:  Recent Labs Lab 01/05/13 1340  LABPROT 14.9  INR 1.19   Urine Drug Screen: Drugs of Abuse     Component Value Date/Time   LABOPIA NONE DETECTED 01/05/2013 1409   COCAINSCRNUR NONE DETECTED 01/05/2013 1409   LABBENZ NONE DETECTED 01/05/2013 1409   AMPHETMU NONE DETECTED 01/05/2013 1409   THCU NONE DETECTED 01/05/2013 1409   LABBARB NONE DETECTED 01/05/2013 1409    Alcohol Level:  Recent Labs Lab 01/05/13 1340  ETH <11   Urinalysis:  Recent Labs Lab 01/05/13 1409  COLORURINE YELLOW  LABSPEC 1.007  PHURINE 7.5  GLUCOSEU NEGATIVE  HGBUR NEGATIVE  BILIRUBINUR NEGATIVE  KETONESUR NEGATIVE  PROTEINUR NEGATIVE  UROBILINOGEN 0.2  NITRITE NEGATIVE  LEUKOCYTESUR NEGATIVE   Misc. Labs: HA1C  Micro Results: No results found for this or any previous visit (from the past 240 hour(s)). Studies/Results: Ct Head (brain) Wo Contrast  01/05/2013  *RADIOLOGY REPORT*  Clinical Data: 57 year old male with slurred speech and left hand tingling.  CT HEAD WITHOUT CONTRAST  Technique:  Contiguous axial images were obtained from the base of the skull through the vertex without contrast.  Comparison: None  Findings: Periventricular white matter hypodensities likely represent chronic small vessel white matter ischemic changes or possibly subacute to remote infarcts.  No acute intracranial abnormalities are identified, including mass lesion or mass effect, hydrocephalus, extra-axial fluid collection, midline shift, hemorrhage, or acute infarction.  The visualized bony calvarium is unremarkable.  IMPRESSION: No evidence of acute intracranial abnormality.  Probable chronic small vessel white matter ischemic changes or subacute - remote infarcts.  Critical Value/emergent results were called by telephone at the time of interpretation on 01/05/2013 at 1:35 p.m. to Dr. Sylvan Cheese, who verbally acknowledged these results.   Original Report Authenticated By:  Harmon Pier, M.D.    Mr Angiogram Head Wo Contrast  01/05/2013  *RADIOLOGY REPORT*  Clinical Data:  Lightheadedness with vertigo, left arm numbness and tingling with slurred speech. Risk factors for stroke including prior stroke, hypertension, diabetes mellitus, and tobacco abuse.  MRI HEAD WITHOUT CONTRAST MRA HEAD WITHOUT CONTRAST  Technique:  Multiplanar, multiecho pulse sequences of the brain and surrounding structures were obtained without intravenous contrast. Angiographic images of the head were obtained using MRA technique without contrast.  Comparison:   01/05/2013 CT head.  MRI HEAD  Findings:  There is no evidence for acute infarction, intracranial hemorrhage, mass lesion, hydrocephalus, or extra-axial fluid. There is mild premature atrophy.  There is extensive subcortical and periventricular white matter signal abnormality likely representing chronic microvascular ischemic change.  No foci of chronic hemorrhage.  Flow voids are maintained in the major intracranial vessels.  The calvarium is intact.  Pituitary and cerebellar tonsils are unremarkable.  No acute sinus or mastoid disease.  Negative orbits.  IMPRESSION: Extensive chronic microvascular ischemic change.  Mild premature atrophy.  No acute intracranial findings.  MRA HEAD  Findings: The internal carotid arteries show mild irregularity in the supraclinoid segments.  The basilar artery is mildly irregular without focal narrowing.  Moderate irregularity A1 segment right anterior cerebral artery. The left anterior cerebral artery widely patent.  No M1 MCA segment disease.  Potentially flow limiting stenosis right posterior temporal branch and left anterior temporal branch proximal M2 MCA segments.  Moderate irregularity distal MCA and PCA branches.  Vertebrals codominant.  No PCA stenosis.  No cerebellar branch occlusion.  No intracranial aneurysm.  IMPRESSION: Potentially flow reducing stenoses of right and left MCA branches at their proximal M2  segments. No proximal flow reducing lesion of the carotid or basilar arteries.   Original Report Authenticated By: Davonna Belling, M.D.    Mr Brain Wo Contrast  01/05/2013  *RADIOLOGY REPORT*  Clinical Data:  Lightheadedness with vertigo, left arm numbness and tingling with slurred speech. Risk factors for stroke including prior stroke, hypertension, diabetes mellitus, and tobacco abuse.  MRI HEAD WITHOUT CONTRAST MRA HEAD WITHOUT CONTRAST  Technique:  Multiplanar, multiecho pulse sequences of the brain and surrounding structures were obtained without intravenous contrast. Angiographic images of the head were obtained using MRA technique without contrast.  Comparison:   01/05/2013 CT head.  MRI HEAD  Findings:  There is no evidence for acute infarction, intracranial hemorrhage, mass lesion, hydrocephalus, or extra-axial fluid. There is mild premature atrophy.  There is extensive subcortical and periventricular white matter signal abnormality likely representing chronic microvascular ischemic change.  No foci of chronic hemorrhage.  Flow voids are maintained in the major intracranial vessels.  The calvarium is intact.  Pituitary and cerebellar tonsils are unremarkable.  No acute sinus or mastoid disease.  Negative orbits.  IMPRESSION: Extensive chronic microvascular ischemic change.  Mild premature  atrophy.  No acute intracranial findings.  MRA HEAD  Findings: The internal carotid arteries show mild irregularity in the supraclinoid segments.  The basilar artery is mildly irregular without focal narrowing.  Moderate irregularity A1 segment right anterior cerebral artery. The left anterior cerebral artery widely patent.  No M1 MCA segment disease.  Potentially flow limiting stenosis right posterior temporal branch and left anterior temporal branch proximal M2 MCA segments.  Moderate irregularity distal MCA and PCA branches.  Vertebrals codominant.  No PCA stenosis.  No cerebellar branch occlusion.  No intracranial  aneurysm.  IMPRESSION: Potentially flow reducing stenoses of right and left MCA branches at their proximal M2 segments. No proximal flow reducing lesion of the carotid or basilar arteries.   Original Report Authenticated By: Davonna Belling, M.D.    Medications:  Scheduled Meds: . aspirin  325 mg Oral Daily  . atorvastatin  80 mg Oral q1800  . cloNIDine  0.3 mg Oral BID   Continuous Infusions:  PRN Meds:. Assessment/Plan: 57 y.o. male PMH HTN, Borderline DM, dyslipidemia who presents with dysarthria, left hand tingling/numbness, h/a lightheadedness prior to admission   1. TIA -symptoms of slurred speech, lightheadedness, h/a, left hand numbness tingling have resolved  -Pending HgbA1c, echo, US doppler  -PT consult, OT consult, Speech consult  -pending Neuro recommendations post MRI/A -Antiplatelet with 325mg  daily-spoke with Neuro and confirmed to d/c with Aspirin not plavix as patient was not taking Aspirin prior to admission  -Frequent neuro checks   2. HTN history  -Intermittently uncontrolled.  167/108 on admission.  Monitor VS. BP 117/81 this am -Resumed Clonidine 0.3 bid.  Patient brought medications and he was actually taking Lisinopril 60 mg qd, Clonidine 0.2 mg bid, Norvasc 10 mg   3. Dyslipidemia history      Component Value Date/Time   CHOL 160 01/06/2013 0500   TRIG 137 01/06/2013 0500   HDL 48 01/06/2013 0500   CHOLHDL 3.3 01/06/2013 0500   VLDL 27 01/06/2013 0500   LDLCALC 85 01/06/2013 0500   Lipitor 80 mg qhs Home was on Pravastatin 40 mg will d/c with Lipitor 40 mg qhs     4. Tobacco abuse -smoking cessation   5. F/E/N  -will monitor and replace electrolytes prn  -cardiac diet   6. DVT px  -SCDS       Dispo: d/c today after US carotid and echo  The patient does have a current PCP June Leap MD [phone].  He has an appointment 02/05/13 in [place] but plans to establish at [place] in [place] or [place].   The patient does not have transportation  limitations that hinder transportation to clinic appointments.  .Services Needed at time of discharge: Y = Yes, Blank = No PT:   OT:   RN:   Equipment:   Other:     LOS: 1 day   Annett Gula [phone] 01/06/2013, 9:13 AM

## 2013-01-06 NOTE — Progress Notes (Signed)
UR COMPLETED  

## 2013-01-07 NOTE — Care Management Note (Signed)
    Page 1 of 1   01/07/2013     11:19:02 AM   CARE MANAGEMENT NOTE 01/07/2013  Patient:  Benjamin Clark,Benjamin Clark   Account Number:  [id number]  Date Initiated:  01/07/2013  Documentation initiated by:  [facility]  Subjective/Objective Assessment:   admitted for TIA workup     Action/Plan:   return home  PT/OT evals- no follow up needs   Anticipated DC Date:     Anticipated DC Plan:        DC Planning Services  CM consult      Choice offered to / List presented to:             Status of service:  Completed, signed off Medicare Important Message given?   (If response is "NO", the following Medicare IM given date fields will be blank) Date Medicare IM given:   Date Additional Medicare IM given:    Discharge Disposition:  HOME/SELF CARE  Per UR Regulation:  Reviewed for med. necessity/level of care/duration of stay  If discussed at Long Length of Stay Meetings, dates discussed:    Comments:

## 2014-10-08 ENCOUNTER — Inpatient Hospital Stay (HOSPITAL_COMMUNITY)
Admission: EM | Admit: 2014-10-08 | Discharge: 2014-10-09 | DRG: 065 | Disposition: A | Discharge status: Home / Self Care | Payer: Self-pay | Attending: Internal Medicine | Admitting: Internal Medicine

## 2014-10-08 ENCOUNTER — Emergency Department (HOSPITAL_COMMUNITY): Payer: 59

## 2014-10-08 ENCOUNTER — Encounter (HOSPITAL_COMMUNITY): Payer: Self-pay | Admitting: Emergency Medicine

## 2014-10-08 DIAGNOSIS — Z7982 Long term (current) use of aspirin: Secondary | ICD-10-CM

## 2014-10-08 DIAGNOSIS — Z87891 Personal history of nicotine dependence: Secondary | ICD-10-CM

## 2014-10-08 DIAGNOSIS — Z8249 Family history of ischemic heart disease and other diseases of the circulatory system: Secondary | ICD-10-CM

## 2014-10-08 DIAGNOSIS — R4781 Slurred speech: Secondary | ICD-10-CM | POA: Insufficient documentation

## 2014-10-08 DIAGNOSIS — G8191 Hemiplegia, unspecified affecting right dominant side: Secondary | ICD-10-CM | POA: Diagnosis present

## 2014-10-08 DIAGNOSIS — R531 Weakness: Secondary | ICD-10-CM

## 2014-10-08 DIAGNOSIS — T50906A Underdosing of unspecified drugs, medicaments and biological substances, initial encounter: Secondary | ICD-10-CM | POA: Diagnosis present

## 2014-10-08 DIAGNOSIS — E785 Hyperlipidemia, unspecified: Secondary | ICD-10-CM | POA: Diagnosis present

## 2014-10-08 DIAGNOSIS — R29810 Facial weakness: Secondary | ICD-10-CM | POA: Diagnosis present

## 2014-10-08 DIAGNOSIS — Z91120 Patient's intentional underdosing of medication regimen due to financial hardship: Secondary | ICD-10-CM | POA: Diagnosis present

## 2014-10-08 DIAGNOSIS — Z833 Family history of diabetes mellitus: Secondary | ICD-10-CM

## 2014-10-08 DIAGNOSIS — Z79899 Other long term (current) drug therapy: Secondary | ICD-10-CM

## 2014-10-08 DIAGNOSIS — Z720 Tobacco use: Secondary | ICD-10-CM

## 2014-10-08 DIAGNOSIS — I10 Essential (primary) hypertension: Secondary | ICD-10-CM | POA: Diagnosis present

## 2014-10-08 DIAGNOSIS — F1290 Cannabis use, unspecified, uncomplicated: Secondary | ICD-10-CM | POA: Diagnosis present

## 2014-10-08 DIAGNOSIS — Z8673 Personal history of transient ischemic attack (TIA), and cerebral infarction without residual deficits: Secondary | ICD-10-CM

## 2014-10-08 DIAGNOSIS — R471 Dysarthria and anarthria: Secondary | ICD-10-CM | POA: Diagnosis present

## 2014-10-08 DIAGNOSIS — I6350 Cerebral infarction due to unspecified occlusion or stenosis of unspecified cerebral artery: Secondary | ICD-10-CM

## 2014-10-08 DIAGNOSIS — E119 Type 2 diabetes mellitus without complications: Secondary | ICD-10-CM | POA: Diagnosis present

## 2014-10-08 DIAGNOSIS — I639 Cerebral infarction, unspecified: Principal | ICD-10-CM | POA: Insufficient documentation

## 2014-10-08 DIAGNOSIS — E109 Type 1 diabetes mellitus without complications: Secondary | ICD-10-CM

## 2014-10-08 LAB — URINALYSIS, ROUTINE W REFLEX MICROSCOPIC
Bilirubin Urine: NEGATIVE
Glucose, UA: NEGATIVE mg/dL
Hgb urine dipstick: NEGATIVE
Ketones, ur: NEGATIVE mg/dL
LEUKOCYTES UA: NEGATIVE
Nitrite: NEGATIVE
PH: 6.5 (ref 5.0–8.0)
PROTEIN: NEGATIVE mg/dL
SPECIFIC GRAVITY, URINE: 1.016 (ref 1.005–1.030)
Urobilinogen, UA: 0.2 mg/dL (ref 0.0–1.0)

## 2014-10-08 LAB — DIFFERENTIAL
BASOS ABS: 0 10*3/uL (ref 0.0–0.1)
BASOS PCT: 1 % (ref 0–1)
Eosinophils Absolute: 0.1 10*3/uL (ref 0.0–0.7)
Eosinophils Relative: 2 % (ref 0–5)
LYMPHS ABS: 2.1 10*3/uL (ref 0.7–4.0)
Lymphocytes Relative: 38 % (ref 12–46)
MONOS PCT: 9 % (ref 3–12)
Monocytes Absolute: 0.5 10*3/uL (ref 0.1–1.0)
NEUTROS ABS: 2.8 10*3/uL (ref 1.7–7.7)
Neutrophils Relative %: 50 % (ref 43–77)

## 2014-10-08 LAB — COMPREHENSIVE METABOLIC PANEL
ALK PHOS: 44 U/L (ref 39–117)
ALT: 20 U/L (ref 0–53)
AST: 22 U/L (ref 0–37)
Albumin: 4.3 g/dL (ref 3.5–5.2)
Anion gap: 8 (ref 5–15)
BUN: 9 mg/dL (ref 6–23)
CHLORIDE: 103 mmol/L (ref 96–112)
CO2: 26 mmol/L (ref 19–32)
Calcium: 9 mg/dL (ref 8.4–10.5)
Creatinine, Ser: 1.06 mg/dL (ref 0.50–1.35)
GFR, EST AFRICAN AMERICAN: 88 mL/min — AB (ref >90)
GFR, EST NON AFRICAN AMERICAN: 76 mL/min — AB (ref >90)
GLUCOSE: 91 mg/dL (ref 70–99)
Potassium: 3.5 mmol/L (ref 3.5–5.1)
SODIUM: 137 mmol/L (ref 135–145)
TOTAL PROTEIN: 7.3 g/dL (ref 6.0–8.3)
Total Bilirubin: 0.6 mg/dL (ref 0.3–1.2)

## 2014-10-08 LAB — I-STAT CHEM 8, ED
BUN: 10 mg/dL (ref 6–23)
CALCIUM ION: 1.12 mmol/L (ref 1.12–1.23)
Chloride: 102 mmol/L (ref 96–112)
Creatinine, Ser: 1 mg/dL (ref 0.50–1.35)
Glucose, Bld: 89 mg/dL (ref 70–99)
HCT: 50 % (ref 39.0–52.0)
Hemoglobin: 17 g/dL (ref 13.0–17.0)
POTASSIUM: 3.5 mmol/L (ref 3.5–5.1)
SODIUM: 140 mmol/L (ref 135–145)
TCO2: 22 mmol/L (ref 0–100)

## 2014-10-08 LAB — CBC
HCT: 42.7 % (ref 39.0–52.0)
Hemoglobin: 15.1 g/dL (ref 13.0–17.0)
MCH: 30.3 pg (ref 26.0–34.0)
MCHC: 35.4 g/dL (ref 30.0–36.0)
MCV: 85.7 fL (ref 78.0–100.0)
Platelets: 251 10*3/uL (ref 150–400)
RBC: 4.98 MIL/uL (ref 4.22–5.81)
RDW: 13.5 % (ref 11.5–15.5)
WBC: 5.5 10*3/uL (ref 4.0–10.5)

## 2014-10-08 LAB — APTT: aPTT: 30 seconds (ref 24–37)

## 2014-10-08 LAB — RAPID URINE DRUG SCREEN, HOSP PERFORMED
Amphetamines: NOT DETECTED
BENZODIAZEPINES: NOT DETECTED
Barbiturates: NOT DETECTED
COCAINE: NOT DETECTED
OPIATES: NOT DETECTED
Tetrahydrocannabinol: POSITIVE — AB

## 2014-10-08 LAB — CBG MONITORING, ED: Glucose-Capillary: 81 mg/dL (ref 70–99)

## 2014-10-08 LAB — PROTIME-INR
INR: 1.18 (ref 0.00–1.49)
PROTHROMBIN TIME: 15.2 s (ref 11.6–15.2)

## 2014-10-08 LAB — I-STAT TROPONIN, ED: Troponin i, poc: 0.01 ng/mL (ref 0.00–0.08)

## 2014-10-08 LAB — ETHANOL: Alcohol, Ethyl (B): <5 mg/dL (ref 0–9)

## 2014-10-08 MED ORDER — HYDRALAZINE HCL 20 MG/ML IJ SOLN: 10.0000 mg | INTRAMUSCULAR | Status: DC | PRN

## 2014-10-08 MED ORDER — ATORVASTATIN CALCIUM 40 MG PO TABS
40.0000 mg | ORAL_TABLET | Freq: Every day | ORAL | Status: DC
  Administered 2014-10-08: 40 mg via ORAL
  Filled 2014-10-08: qty 1

## 2014-10-08 MED ORDER — LISINOPRIL 20 MG PO TABS
20.0000 mg | ORAL_TABLET | Freq: Once | ORAL | Status: AC
  Administered 2014-10-08: 20 mg via ORAL
  Filled 2014-10-08: qty 1

## 2014-10-08 MED ORDER — ONDANSETRON HCL 4 MG/2ML IJ SOLN
4.0000 mg | Freq: Four times a day (QID) | INTRAMUSCULAR | Status: DC | PRN
  Administered 2014-10-09: 4 mg via INTRAVENOUS
  Filled 2014-10-08: qty 2

## 2014-10-08 MED ORDER — STROKE: EARLY STAGES OF RECOVERY BOOK: Freq: Once | Status: DC

## 2014-10-08 MED ORDER — HEPARIN SODIUM (PORCINE) 5000 UNIT/ML IJ SOLN
5000.0000 [IU] | Freq: Three times a day (TID) | INTRAMUSCULAR | Status: DC
  Administered 2014-10-08 – 2014-10-09 (×2): 5000 [IU] via SUBCUTANEOUS
  Filled 2014-10-08 (×2): qty 1

## 2014-10-08 MED ORDER — ASPIRIN EC 81 MG PO TBEC
81.0000 mg | DELAYED_RELEASE_TABLET | Freq: Every day | ORAL | Status: DC
  Administered 2014-10-08: 81 mg via ORAL
  Filled 2014-10-08: qty 1

## 2014-10-08 MED ORDER — ASPIRIN EC 325 MG PO TBEC
325.0000 mg | DELAYED_RELEASE_TABLET | Freq: Every day | ORAL | Status: DC
  Administered 2014-10-09: 325 mg via ORAL
  Filled 2014-10-08 (×2): qty 1

## 2014-10-08 MED ORDER — AMLODIPINE BESYLATE 5 MG PO TABS
10.0000 mg | ORAL_TABLET | Freq: Once | ORAL | Status: AC
Start: 2014-10-08 — End: 2014-10-08
  Administered 2014-10-08: 10 mg via ORAL
  Filled 2014-10-08: qty 2

## 2014-10-08 MED ORDER — LORAZEPAM 1 MG PO TABS: 1.0000 mg | ORAL_TABLET | Freq: Four times a day (QID) | ORAL | Status: DC | PRN

## 2014-10-08 MED ORDER — ALBUTEROL SULFATE (2.5 MG/3ML) 0.083% IN NEBU: 2.5000 mg | INHALATION_SOLUTION | RESPIRATORY_TRACT | Status: DC | PRN

## 2014-10-08 MED ORDER — OXYCODONE HCL 5 MG PO TABS
5.0000 mg | ORAL_TABLET | ORAL | Status: DC | PRN
  Administered 2014-10-09: 5 mg via ORAL
  Filled 2014-10-08 (×2): qty 1

## 2014-10-08 MED ORDER — LORAZEPAM 2 MG/ML IJ SOLN
1.0000 mg | Freq: Once | INTRAMUSCULAR | Status: AC
  Administered 2014-10-08: 1 mg via INTRAVENOUS
  Filled 2014-10-08: qty 1

## 2014-10-08 MED ORDER — ACETAMINOPHEN 325 MG PO TABS
650.0000 mg | ORAL_TABLET | Freq: Four times a day (QID) | ORAL | Status: DC | PRN
  Administered 2014-10-08: 650 mg via ORAL
  Filled 2014-10-08: qty 2

## 2014-10-08 MED ORDER — HYDRALAZINE HCL 20 MG/ML IJ SOLN
10.0000 mg | INTRAMUSCULAR | Status: DC | PRN
  Administered 2014-10-08: 10 mg via INTRAVENOUS
  Filled 2014-10-08: qty 1

## 2014-10-08 NOTE — ED Notes (Signed)
Attempted report 

## 2014-10-08 NOTE — ED Notes (Signed)
Patient transported to MRI 
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
## 2014-10-08 NOTE — ED Notes (Signed)
Spoke with 4N Charge advised of Pt BP and MAP to see if pt candidate for floor. Charge Nurse stated pt is a candidate for the floor as long as pt not on any BP medication drips.
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
## 2014-10-09 DIAGNOSIS — I6789 Other cerebrovascular disease: Secondary | ICD-10-CM

## 2014-10-09 DIAGNOSIS — E785 Hyperlipidemia, unspecified: Secondary | ICD-10-CM

## 2014-10-09 DIAGNOSIS — F1210 Cannabis abuse, uncomplicated: Secondary | ICD-10-CM

## 2014-10-09 LAB — LIPID PANEL
CHOLESTEROL: 226 mg/dL — AB (ref 0–200)
HDL: 37 mg/dL — ABNORMAL LOW (ref >39)
LDL CALC: 150 mg/dL — AB (ref 0–99)
Total CHOL/HDL Ratio: 6.1 RATIO
Triglycerides: 196 mg/dL — ABNORMAL HIGH (ref <150)
VLDL: 39 mg/dL (ref 0–40)

## 2014-10-09 MED ORDER — PRAVASTATIN SODIUM 40 MG PO TABS: 40.0000 mg | ORAL_TABLET | Freq: Every day | ORAL | Status: DC

## 2014-10-09 MED ORDER — LATANOPROST 0.005 % OP SOLN
1.0000 [drp] | Freq: Every day | OPHTHALMIC | Status: DC
  Filled 2014-10-09: qty 2.5

## 2014-10-09 MED ORDER — ASPIRIN EC 325 MG PO TBEC: 325.0000 mg | DELAYED_RELEASE_TABLET | Freq: Every day | ORAL | Status: AC

## 2014-10-09 MED ORDER — BISOPROLOL-HYDROCHLOROTHIAZIDE 5-6.25 MG PO TABS
1.0000 | ORAL_TABLET | Freq: Every day | ORAL | Status: DC
  Administered 2014-10-09: 1 via ORAL
  Filled 2014-10-09: qty 1

## 2014-10-09 MED ORDER — PANTOPRAZOLE SODIUM 40 MG PO TBEC
40.0000 mg | DELAYED_RELEASE_TABLET | Freq: Every day | ORAL | Status: DC
  Administered 2014-10-09: 40 mg via ORAL
  Filled 2014-10-09: qty 1

## 2014-10-09 MED ORDER — HYDRALAZINE HCL 20 MG/ML IJ SOLN
10.0000 mg | Freq: Once | INTRAMUSCULAR | Status: DC
Start: 2014-10-09 — End: 2014-10-09
  Filled 2014-10-09: qty 1

## 2014-10-09 MED ORDER — BENAZEPRIL HCL 40 MG PO TABS: 40.0000 mg | ORAL_TABLET | Freq: Every day | ORAL | Status: DC

## 2014-10-09 MED ORDER — LISINOPRIL 20 MG PO TABS
60.0000 mg | ORAL_TABLET | Freq: Every day | ORAL | Status: DC
  Administered 2014-10-09: 60 mg via ORAL
  Filled 2014-10-09: qty 3

## 2014-10-09 MED ORDER — AMLODIPINE BESYLATE 10 MG PO TABS
10.0000 mg | ORAL_TABLET | Freq: Every day | ORAL | Status: DC
  Administered 2014-10-09: 10 mg via ORAL
  Filled 2014-10-09: qty 1

## 2014-10-09 MED ORDER — BISOPROLOL-HYDROCHLOROTHIAZIDE 5-6.25 MG PO TABS: 1.0000 | ORAL_TABLET | Freq: Every day | ORAL | Status: DC

## 2014-10-09 MED ORDER — BENAZEPRIL HCL 20 MG PO TABS: 40.0000 mg | ORAL_TABLET | Freq: Every day | ORAL | Status: DC

## 2014-10-09 MED ORDER — HYDRALAZINE HCL 20 MG/ML IJ SOLN: 10.0000 mg | INTRAMUSCULAR | Status: DC | PRN

## 2014-10-09 NOTE — Progress Notes (Signed)
OT Cancellation Note  Patient Details Name: Lucilla LameClifton Subramaniam MRN: [id number] DOB: 10-18-1955   Cancelled Treatment:    Reason Eval/Treat Not Completed: OT screened, no needs identified, will sign off Symptoms have resolved per chart and pt.   Nena JordanMiller, Saheed Carrington M   Carney LivingLeeAnn Marie Charlyn Vialpando, OTR/L Occupational Therapist [phone] (pager)  10/09/2014, 1:30 PM

## 2014-10-09 NOTE — Progress Notes (Signed)
Physical Therapy Evaluation Patient Details Name: Benjamin Clark MRN: [id number] DOB: 11-08-1955 Today's Date: 10/09/2014   History of Present Illness  Patient is a 59 yo male admitted 10/08/14 with Rt-sided weakness, facial droop, dysarthria, and uncontrolled HTN.  Symptoms have resolved per chart and patient.  PMH:  HTN, DM, CVA 2005, HLD, anxiety  Clinical Impression  Patient is independent with all mobility and gait.  Scored 24/24 on DGI balance assessment.  No acute PT needs - PT will sign off.  Patient ready for d/c from PT perspective.    Follow Up Recommendations No PT follow up;Supervision - Intermittent    Equipment Recommendations  None recommended by PT    Recommendations for Other Services       Precautions / Restrictions Precautions Precautions: None Restrictions Weight Bearing Restrictions: No      Mobility  Bed Mobility Overal bed mobility: Independent                Transfers Overall transfer level: Independent Equipment used: None                Ambulation/Gait Ambulation/Gait assistance: Independent Ambulation Distance (Feet): 300 Feet Assistive device: None Gait Pattern/deviations: WFL(Within Functional Limits)   Gait velocity interpretation: at or above normal speed for age/gender General Gait Details: Patient with good gait pattern, speed, and balance.  Stairs Stairs: Yes Stairs assistance: Independent Stair Management: No rails;Alternating pattern;Forwards Number of Stairs: 7 General stair comments: No physical assist required.  No rail for balance testing.  Encouraged patient to use rail at home for safety.  Wheelchair Mobility    Modified Rankin (Stroke Patients Only) Modified Rankin (Stroke Patients Only) Pre-Morbid Rankin Score: No symptoms Modified Rankin: No symptoms     Balance                                 Standardized Balance Assessment Standardized Balance Assessment : Dynamic Gait Index    Dynamic Gait Index Level Surface: Normal Change in Gait Speed: Normal Gait with Horizontal Head Turns: Normal Gait with Vertical Head Turns: Normal Gait and Pivot Turn: Normal Step Over Obstacle: Normal Step Around Obstacles: Normal Steps: Normal Total Score: 24       Pertinent Vitals/Pain Pain Assessment: 0-10 Pain Score: 2  Pain Location: Head Pain Descriptors / Indicators: Headache Pain Intervention(s): Monitored during session    Home Living Family/patient expects to be discharged to:: Private residence Living Arrangements: Spouse/significant other Available Help at Discharge: Family;Available 24 hours/day (for several days; fiance' works) Type of Home: House Home Access: Stairs to enter Entrance Stairs-Rails: None Secretary/administrator of Steps: 2 Home Layout: Two level;Bed/bath upstairs Home Equipment: None      Prior Function Level of Independence: Independent               Hand Dominance        Extremity/Trunk Assessment   Upper Extremity Assessment: Overall WFL for tasks assessed           Lower Extremity Assessment: Overall WFL for tasks assessed      Cervical / Trunk Assessment: Normal  Communication   Communication: No difficulties  Cognition Arousal/Alertness: Awake/alert Behavior During Therapy: WFL for tasks assessed/performed Overall Cognitive Status: Within Functional Limits for tasks assessed                      General Comments      Exercises  Assessment/Plan    PT Assessment Patent does not need any further PT services  PT Diagnosis Abnormality of gait   PT Problem List    PT Treatment Interventions     PT Goals (Current goals can be found in the Care Plan section) Acute Rehab PT Goals PT Goal Formulation: All assessment and education complete, DC therapy    Frequency     Barriers to discharge        Co-evaluation               End of Session   Activity Tolerance: Patient  tolerated treatment well Patient left: in bed;with call bell/phone within reach;with family/visitor present Nurse Communication: Mobility status (No further PT needs)         Time: 9811-91471058-1114 PT Time Calculation (min) (ACUTE ONLY): 16 min   Charges:   PT Evaluation $Initial PT Evaluation Tier I: 1 Procedure     PT G CodesVena Austria:        [name] 10/09/2014, 12:55 PM Durenda HurtSusan H. Renaldo Fiddleravis, PT, [facility] Acute Rehab Services Pager [phone]

## 2014-10-09 NOTE — Progress Notes (Signed)
Patient DC'd home with fiance.  Dc instructions and prescriptions given to patient.  Pt. Refused hydralazine to control high blood pressure and wanted to discharge immediately.  Vital signs were not stable prior to discharge and patient was aware of it and the risks of him leaving without further treatment.

## 2014-10-09 NOTE — Progress Notes (Signed)
*  PRELIMINARY RESULTS* Vascular Ultrasound Carotid Duplex (Doppler) has been completed.  Preliminary findings: Bilateral:  1-39% ICA stenosis.  Vertebral artery flow is antegrade.     Farrel DemarkJill Eunice, RDMS, RVT  10/09/2014, 3:00 PM

## 2014-10-09 NOTE — Progress Notes (Signed)
Echocardiogram 2D Echocardiogram has been performed.  [name], Benjamin Clark M 10/09/2014, 9:02 AM
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
## 2014-10-09 NOTE — Progress Notes (Signed)
CARE MANAGEMENT NOTE 10/09/2014  Patient:  Benjamin Clark,Benjamin Clark   Account Number:  [id number]  Date Initiated:  10/09/2014  Documentation initiated by:  Amsc LLCJEFFRIES,Janaysia Mcleroy  Subjective/Objective Assessment:   adm:. Hypertension, uncontrolled  . Acute CVA (cerebrovascular accident)     Action/Plan:   discharge planning   Anticipated DC Date:  10/09/2014   Anticipated DC Plan:  HOME/SELF CARE      DC Planning Services  CM consult  MATCH Program  Medication Assistance      Choice offered to / List presented to:             Status of service:   Medicare Important Message given?   (If response is "NO", the following Medicare IM given date fields will be blank) Date Medicare IM given:   Medicare IM given by:   Date Additional Medicare IM given:   Additional Medicare IM given by:    Discharge Disposition:  HOME/SELF CARE  Per UR Regulation:    If discussed at Long Length of Stay Meetings, dates discussed:    Comments:  10/09/14  CM gave pt MATCH letter with list of participating pharmacies.  Pt verbalized understanding of MATCH parameters.  Pt also given [facility] pamphlet and pt verbalized understanding he is to go to the clinic any weekday morning from 9-10 am and ask for: AN APPOINTMENT FOR A PCP; AN APPOINTMENT WITH A NAVIGATOR TO SECURE INSURANCE; AND AN APPOINTMENT FOR A FOLLOW UP MEDICAL CARE.  No other Cm needs were communicated.  Benjamin JakschSarah Veverly Clark, BSN, CM [phone].

## 2014-10-10 LAB — HEMOGLOBIN A1C
HEMOGLOBIN A1C: 6.4 % — AB (ref 4.8–5.6)
Hgb A1c MFr Bld: 6.3 % — ABNORMAL HIGH (ref 4.8–5.6)
Mean Plasma Glucose: 137 mg/dL
Mean Plasma Glucose: 134 mg/dL

## 2015-01-09 ENCOUNTER — Ambulatory Visit: Payer: MEDICAID | Admitting: Neurology

## 2015-01-10 ENCOUNTER — Encounter: Payer: Self-pay | Admitting: Neurology

## 2015-05-11 ENCOUNTER — Telehealth: Payer: Self-pay | Admitting: Neurology

## 2015-05-11 ENCOUNTER — Other Ambulatory Visit: Payer: Self-pay | Admitting: Neurology

## 2015-05-11 MED ORDER — BENAZEPRIL HCL 40 MG PO TABS: 40.0000 mg | ORAL_TABLET | Freq: Every day | ORAL | Status: DC

## 2015-05-11 MED ORDER — BISOPROLOL-HYDROCHLOROTHIAZIDE 5-6.25 MG PO TABS: 1.0000 | ORAL_TABLET | Freq: Every day | ORAL | Status: DC

## 2015-05-11 NOTE — Telephone Encounter (Signed)
Patient is calling to get a refill called in for benazepril (LOTENSIN) 40 MG tablet and bisoprolol-hydrochlorothiazide (ZIAC) 5-6.25 MG per tablet. Patient has an appointment scheduled for 06-13-15. Please call Rx to [facility] in [place]. Thank you.

## 2015-06-13 ENCOUNTER — Encounter: Payer: Self-pay | Admitting: Neurology

## 2015-06-13 ENCOUNTER — Ambulatory Visit (INDEPENDENT_AMBULATORY_CARE_PROVIDER_SITE_OTHER): Payer: BLUE CROSS/BLUE SHIELD | Admitting: Neurology

## 2015-06-13 ENCOUNTER — Other Ambulatory Visit: Payer: Self-pay | Admitting: Neurology

## 2015-06-13 VITALS — BP 187/118 | HR 55 | Ht 72.0 in

## 2015-06-13 DIAGNOSIS — I639 Cerebral infarction, unspecified: Secondary | ICD-10-CM

## 2015-06-13 DIAGNOSIS — I6381 Other cerebral infarction due to occlusion or stenosis of small artery: Secondary | ICD-10-CM

## 2015-06-13 MED ORDER — PRAVASTATIN SODIUM 40 MG PO TABS: 40.0000 mg | ORAL_TABLET | Freq: Every day | ORAL | Status: AC

## 2015-06-13 MED ORDER — BENAZEPRIL HCL 40 MG PO TABS: 40.0000 mg | ORAL_TABLET | Freq: Every day | ORAL | Status: AC

## 2015-06-13 MED ORDER — BISOPROLOL-HYDROCHLOROTHIAZIDE 5-6.25 MG PO TABS: 1.0000 | ORAL_TABLET | Freq: Every day | ORAL | Status: AC

## 2015-06-13 NOTE — Patient Instructions (Signed)
I had a long d/w patient about his recent stroke, risk for recurrent stroke/TIAs, personally independently reviewed imaging studies and stroke evaluation results and answered questions.Continue aspirin 325 mg orally every day  for secondary stroke prevention and maintain strict control of hypertension with blood pressure goal below 130/90, diabetes with hemoglobin A1c goal below 6.5% and lipids with LDL cholesterol goal below 100 mg/dL. I also advised the patient to eat a healthy diet with plenty of whole grains, cereals, fruits and vegetables, exercise regularly and maintain ideal body weight. Patient was given a refill for his blood pressure medications and protocol and instructed to find a primary care physician and get further prescriptions from him.   Followup in the future with me in future in 6 months or call earlier if necessary. Stroke Prevention Some medical conditions and behaviors are associated with an increased chance of having a stroke. You may prevent a stroke by making healthy choices and managing medical conditions. HOW CAN I REDUCE MY RISK OF HAVING A STROKE?   Stay physically active. Get at least 30 minutes of activity on most or all days.  Do not smoke. It may also be helpful to avoid exposure to secondhand smoke.  Limit alcohol use. Moderate alcohol use is considered to be:  No more than 2 drinks per day for men.  No more than 1 drink per day for nonpregnant women.  Eat healthy foods. This involves:  Eating 5 or more servings of fruits and vegetables a day.  Making dietary changes that address high blood pressure (hypertension), high cholesterol, diabetes, or obesity.  Manage your cholesterol levels.  Making food choices that are high in fiber and low in saturated fat, trans fat, and cholesterol may control cholesterol levels.  Take any prescribed medicines to control cholesterol as directed by your health care provider.  Manage your diabetes.  Controlling your  carbohydrate and sugar intake is recommended to manage diabetes.  Take any prescribed medicines to control diabetes as directed by your health care provider.  Control your hypertension.  Making food choices that are low in salt (sodium), saturated fat, trans fat, and cholesterol is recommended to manage hypertension.  Ask your health care provider if you need treatment to lower your blood pressure. Take any prescribed medicines to control hypertension as directed by your health care provider.  If you are 42-44 years of age, have your blood pressure checked every 3-5 years. If you are 36 years of age or older, have your blood pressure checked every year.  Maintain a healthy weight.  Reducing calorie intake and making food choices that are low in sodium, saturated fat, trans fat, and cholesterol are recommended to manage weight.  Stop drug abuse.  Avoid taking birth control pills.  Talk to your health care provider about the risks of taking birth control pills if you are over 45 years old, smoke, get migraines, or have ever had a blood clot.  Get evaluated for sleep disorders (sleep apnea).  Talk to your health care provider about getting a sleep evaluation if you snore a lot or have excessive sleepiness.  Take medicines only as directed by your health care provider.  For some people, aspirin or blood thinners (anticoagulants) are helpful in reducing the risk of forming abnormal blood clots that can lead to stroke. If you have the irregular heart rhythm of atrial fibrillation, you should be on a blood thinner unless there is a good reason you cannot take them.  Understand all your medicine  instructions.  Make sure that other conditions (such as anemia or atherosclerosis) are addressed. SEEK IMMEDIATE MEDICAL CARE IF:   You have sudden weakness or numbness of the face, arm, or leg, especially on one side of the body.  Your face or eyelid droops to one side.  You have sudden  confusion.  You have trouble speaking (aphasia) or understanding.  You have sudden trouble seeing in one or both eyes.  You have sudden trouble walking.  You have dizziness.  You have a loss of balance or coordination.  You have a sudden, severe headache with no known cause.  You have new chest pain or an irregular heartbeat. Any of these symptoms may represent a serious problem that is an emergency. Do not wait to see if the symptoms will go away. Get medical help at once. Call your local emergency services (911 in [place] Do not drive yourself to the hospital.   This information is not intended to replace advice given to you by your health care provider. Make sure you discuss any questions you have with your health care provider.   Document Released: 09/26/2004 Document Revised: 09/09/2014 Document Reviewed: 02/19/2013 Elsevier Interactive Patient Education [employer]

## 2015-06-13 NOTE — Progress Notes (Signed)
[facility] [address] [place]. [place] [address] [phone]       OFFICE CONSULT NOTE  Mr. Benjamin Clark Date of Birth:  1955-09-14 Medical Record Number:  [id number]   Referring MD:  Susa Raring, MD  Reason for Referral:  Stroke f/u  HPI: 35 year African-American male seen today for first office follow-up visit following hospital admission for stroke in February 2016. Benjamin Clark is a 59 y.o. male with a past medical history significant for HTN, DM, dyslipidemia, cerebral infarct without residual deficits, TIA x 2 (last TIA 5/15), anxiety, brought in via EMS due to acute onset of confusion, dysarthria, right hemiparesis, and right facial weakness. He said that he woke up and started feeling confused and felt that way while driving to see his PCP. Then, his wife noticed him swerving and later on he started slurring his words, had right sided weakness and right face droopiness. Said that he is having a HA but denied vertigo, double vision, difficulty swallowing, or visual disturbances. EMS indicated that when they arrived to the scene he was significantly weak in the right side and face but this is rapidly improving. SBP>200 NIHSS 3.  CT brain showed no acute abnormality. Of importance, patient had no insurance and said that he had not been taking his medications. Date last known well: 10/08/14 Time last known well: 9:30 am tPA Given: no NIHSS: 3  CT scan of the head showed no acute abnormality and MRI scan showed a punctate tiny right frontal white matter infarct and extensive changes of chronic small vessel disease. Transthoracic echo showed normal ejection fraction. Carotid ultrasound showed no significant extracranial stenosis. LDL cholesterol was elevated at 150. Hemoglobin A1c was 6.3. Patient had not been compliant with his blood pressure or cholesterol medications. He was started on aspirin and blood pressure medicines and Pravachol. He did not have a  primary care physician and was advised to get one but did not do so. He was given blood pressure medications through our office and call to get refills and was advised to be seen because he had not kept his post stroke follow up appointment and hence he is here today. He continues to smoke but has cut back to 7 cigarettes a day. He recently got married 3 weeks ago and now states he will have health insurance through his wife and he plans to get a primary care physician soon. He had opted for recovery from his right-sided weakness and slurred speech and has no neurological complaints today. He does have history of TIAs in the past with the last one being in May 2014  ROS:   14 system review of systems is positive for anxiety, depression and all other systems negative  PMH:  Past Medical History  Diagnosis Date  . Hypertension   . Diabetes mellitus without complication (HCC)     prediabetes per patient  . Stroke (HCC)     2005  . Dyslipidemia     Social History:  Social History   Social History  . Marital Status: Single    Spouse Name: N/A  . Number of Children: N/A  . Years of Education: N/A   Occupational History  . Not on file.   Social History Main Topics  . Smoking status: Current Every Day Smoker -- 1.00 packs/day  . Smokeless tobacco: Not on file  . Alcohol Use: Yes     Comment: occ  . Drug Use: No  . Sexual Activity: Not on file  Other Topics Concern  . Not on file   Social History Narrative   Divorced    1 daughter    From [place] lived in [place]   Moved from [place]   PCP is Dr. Wandra Scot at [facility]   Smoker 4 cigarettes per day. Smoking since age 72 (previously quit 5x), denies other drugs, drinks wine occasionally   Lives in a hotel   Works in Pharmacist, community    Medications:   Current Outpatient Prescriptions on File Prior to Visit  Medication Sig Dispense Refill  . Brinzolamide-Brimonidine 1-0.2 % SUSP Apply  1 drop to eye at bedtime. 1 drop in each eye.    . latanoprost (XALATAN) 0.005 % ophthalmic solution Place 1 drop into both eyes at bedtime.    Marland Kitchen LORazepam (ATIVAN) 2 MG tablet Take 1 mg by mouth daily.    Marland Kitchen omeprazole (PRILOSEC) 20 MG capsule Take 20 mg by mouth daily.    Marland Kitchen aspirin EC 325 MG tablet Take 1 tablet (325 mg total) by mouth daily. (Patient not taking: Reported on 06/13/2015) 30 tablet 11   No current facility-administered medications on file prior to visit.    Allergies:  No Known Allergies  Physical Exam General: well developed, well nourished young African-American male, seated, in no evident distress Head: head normocephalic and atraumatic.   Neck: supple with no carotid or supraclavicular bruits Cardiovascular: regular rate and rhythm, no murmurs Musculoskeletal: no deformity Skin:  no rash/petichiae Vascular:  Normal pulses all extremities  Neurologic Exam Mental Status: Awake and fully alert. Oriented to place and time. Recent and remote memory intact. Attention span, concentration and fund of knowledge appropriate. Mood and affect appropriate.  Cranial Nerves: Fundoscopic exam reveals sharp disc margins. Pupils equal, briskly reactive to light. Extraocular movements full without nystagmus. Visual fields full to confrontation. Hearing intact. Facial sensation intact. Face, tongue, palate moves normally and symmetrically.  Motor: Normal bulk and tone. Normal strength in all tested extremity muscles. Sensory.: intact to touch , pinprick , position and vibratory sensation.  Coordination: Rapid alternating movements normal in all extremities. Finger-to-nose and heel-to-shin performed accurately bilaterally. Gait and Station: Arises from chair without difficulty. Stance is normal. Gait demonstrates normal stride length and balance . Able to heel, toe and tandem walk without difficulty.  Reflexes: 1+ and symmetric. Toes downgoing.   NIHSS  0 Modified Rankin   0  ASSESSMENT: 59 year old African-American male with a right frontal lacunar infarct in for every 2016 secondary to small vessel disease with vascular risk factors of uncontrolled hypertension, hyperlipidemia, prediabetes and smoking. Remote history of TIA in May 2014    PLAN: I had a long d/w patient about his recent stroke, risk for recurrent stroke/TIAs, personally independently reviewed imaging studies and stroke evaluation results and answered questions.Continue aspirin 325 mg orally every day  for secondary stroke prevention and maintain strict control of hypertension with blood pressure goal below 130/90, diabetes with hemoglobin A1c goal below 6.5% and lipids with LDL cholesterol goal below 100 mg/dL. He was counseled to quit smoking completely I also advised the patient to eat a healthy diet with plenty of whole grains, cereals, fruits and vegetables, exercise regularly and maintain ideal body weight. Patient was given a refill for his blood pressure medications and protocol and instructed to find a primary care physician and get further prescriptions from him.   Followup in the future with me in future in 6 months or call earlier if necessary.  Antony Contras, MD  Note: This document was prepared with digital dictation and possible smart phrase technology. Any transcriptional errors that result from this process are unintentional.

## 2015-07-06 ENCOUNTER — Other Ambulatory Visit: Payer: Self-pay | Admitting: Neurology

## 2015-12-12 ENCOUNTER — Ambulatory Visit: Payer: Self-pay | Admitting: Neurology

## 2016-08-20 DIAGNOSIS — I10 Essential (primary) hypertension: Secondary | ICD-10-CM | POA: Diagnosis not present

## 2016-08-20 DIAGNOSIS — J0111 Acute recurrent frontal sinusitis: Secondary | ICD-10-CM | POA: Diagnosis not present

## 2016-08-20 DIAGNOSIS — E781 Pure hyperglyceridemia: Secondary | ICD-10-CM | POA: Diagnosis not present

## 2016-08-20 DIAGNOSIS — J309 Allergic rhinitis, unspecified: Secondary | ICD-10-CM | POA: Diagnosis not present

## 2016-09-25 DIAGNOSIS — Z0001 Encounter for general adult medical examination with abnormal findings: Secondary | ICD-10-CM | POA: Diagnosis not present

## 2016-09-25 DIAGNOSIS — E559 Vitamin D deficiency, unspecified: Secondary | ICD-10-CM | POA: Diagnosis not present

## 2016-09-25 DIAGNOSIS — I10 Essential (primary) hypertension: Secondary | ICD-10-CM | POA: Diagnosis not present

## 2016-09-25 DIAGNOSIS — Z125 Encounter for screening for malignant neoplasm of prostate: Secondary | ICD-10-CM | POA: Diagnosis not present

## 2016-09-25 DIAGNOSIS — Z0000 Encounter for general adult medical examination without abnormal findings: Secondary | ICD-10-CM | POA: Diagnosis not present

## 2016-09-25 DIAGNOSIS — R7302 Impaired glucose tolerance (oral): Secondary | ICD-10-CM | POA: Diagnosis not present

## 2016-10-03 MED FILL — METOPROLOL-HCTZ 100-25 MG T: 100-25 | 90 days supply | Qty: 90 | Fill #0

## 2016-10-03 MED FILL — BENAZEPRIL HCL 40 MG TABLET: 40 | 90 days supply | Qty: 90 | Fill #0

## 2016-10-03 MED FILL — AMLODIPINE BESYLATE 10 MG T: 10 | 87 days supply | Qty: 87 | Fill #0

## 2016-10-03 MED FILL — LATANOPROST 0.005% EYE DRP: 0.005 | 75 days supply | Qty: 8 | Fill #0

## 2016-10-11 MED FILL — VIT D2 1.25 MG (50,000 UNIT: 1.25 MG | 84 days supply | Qty: 24 | Fill #0

## 2016-12-03 MED FILL — PRAVASTATIN NA 40 MG TAB: 40 | 90 days supply | Qty: 90 | Fill #0

## 2016-12-31 MED FILL — METOPROLOL-HCTZ 100-25 MG T: 100-25 | 90 days supply | Qty: 90 | Fill #1

## 2016-12-31 MED FILL — VIT D2 1.25 MG (50,000 UNIT: 1.25 MG | 20 days supply | Qty: 6 | Fill #1

## 2017-01-01 MED FILL — OMEPRAZOLE 20 MG CAPSULE DR: 20 | 90 days supply | Qty: 180 | Fill #0

## 2017-01-01 MED FILL — BENAZEPRIL HCL 40 MG TABLET: 40 | 90 days supply | Qty: 90 | Fill #0

## 2017-01-01 MED FILL — AMLODIPINE BESYLATE 10 MG T: 10 | 87 days supply | Qty: 87 | Fill #0

## 2017-03-17 DIAGNOSIS — Z125 Encounter for screening for malignant neoplasm of prostate: Secondary | ICD-10-CM | POA: Diagnosis not present

## 2017-03-17 DIAGNOSIS — R7302 Impaired glucose tolerance (oral): Secondary | ICD-10-CM | POA: Diagnosis not present

## 2017-03-17 DIAGNOSIS — I10 Essential (primary) hypertension: Secondary | ICD-10-CM | POA: Diagnosis not present

## 2017-03-17 DIAGNOSIS — E559 Vitamin D deficiency, unspecified: Secondary | ICD-10-CM | POA: Diagnosis not present

## 2017-03-17 DIAGNOSIS — R5383 Other fatigue: Secondary | ICD-10-CM | POA: Diagnosis not present

## 2017-03-17 DIAGNOSIS — M2550 Pain in unspecified joint: Secondary | ICD-10-CM | POA: Diagnosis not present

## 2017-03-17 DIAGNOSIS — E781 Pure hyperglyceridemia: Secondary | ICD-10-CM | POA: Diagnosis not present

## 2017-03-21 MED FILL — PRAVASTATIN NA 40 MG TAB: 40 | 90 days supply | Qty: 90 | Fill #1

## 2017-03-26 MED FILL — AMLODIPINE BESYLATE 10 MG T: 10 | 90 days supply | Qty: 90 | Fill #1

## 2017-03-26 MED FILL — LATANOPROST 0.005% EYE DRP: 0.005 | 75 days supply | Qty: 8 | Fill #1

## 2017-03-26 MED FILL — BENAZEPRIL HCL 40 MG TABLET: 40 | 90 days supply | Qty: 90 | Fill #1

## 2017-03-27 MED FILL — VIT D2 1.25 MG (50,000 UNIT: 1.25 MG | 84 days supply | Qty: 24 | Fill #0

## 2017-03-28 MED FILL — METOPROLOL-HCTZ 100-25 MG T: 100-25 | 90 days supply | Qty: 90 | Fill #0

## 2017-06-19 MED FILL — PRAVASTATIN NA 40 MG TAB: 40 | 90 days supply | Qty: 90 | Fill #2

## 2017-06-19 MED FILL — AMLODIPINE BESYLATE 10 MG T: 10 | 90 days supply | Qty: 90 | Fill #2

## 2017-06-19 MED FILL — OMEPRAZOLE 20 MG CAP: 20 | 90 days supply | Qty: 180 | Fill #1

## 2017-06-19 MED FILL — METOPROLOL-HCTZ 100-25 MG T: 100-25 | 90 days supply | Qty: 90 | Fill #1

## 2017-06-19 MED FILL — BENAZEPRIL HCL 40 MG TABLET: 40 | 90 days supply | Qty: 90 | Fill #2

## 2017-09-16 MED FILL — METOPROLOL-HCTZ 100-25 MG T: 100-25 | 90 days supply | Qty: 90 | Fill #2

## 2017-09-17 MED FILL — AMLODIPINE BESYLATE 10 MG T: 10 | 90 days supply | Qty: 90 | Fill #0

## 2017-09-17 MED FILL — LATANOPROST 0.005% EYE DRP: 0.005 | 75 days supply | Qty: 8 | Fill #0

## 2017-09-17 MED FILL — PRAVASTATIN NA 40 MG TAB: 40 | 90 days supply | Qty: 90 | Fill #0

## 2017-09-17 MED FILL — BENAZEPRIL HCL 40 MG TABLET: 40 | 90 days supply | Qty: 90 | Fill #0

## 2017-09-17 MED FILL — OMEPRAZOLE 20 MG CAP: 20 | 90 days supply | Qty: 180 | Fill #0

## 2017-12-23 MED FILL — PRAVASTATIN NA 40 MG TAB: 40 | 90 days supply | Qty: 90 | Fill #0

## 2017-12-23 MED FILL — BENAZEPRIL HCL 40 MG TABLET: 40 | 90 days supply | Qty: 90 | Fill #0

## 2017-12-23 MED FILL — METOPROLOL-HCTZ 100-25 MG T: 100-25 | 90 days supply | Qty: 90 | Fill #0

## 2017-12-23 MED FILL — AMLODIPINE BESYLATE 10 MG T: 10 | 90 days supply | Qty: 90 | Fill #0

## 2017-12-23 MED FILL — OMEPRAZOLE 20 MG CAP: 20 | 90 days supply | Qty: 90 | Fill #0

## 2018-02-23 MED FILL — LATANOPROST 0.005% EYE DRP: 0.005 | 75 days supply | Qty: 8 | Fill #1

## 2018-02-27 MED FILL — OMEPRAZOLE 20 MG CAP: 20 | 12 days supply | Qty: 12 | Fill #1

## 2018-03-09 MED FILL — OMEPRAZOLE 20 MG CAP: 20 | 90 days supply | Qty: 90 | Fill #2

## 2018-03-23 MED FILL — METOPROLOL-HCTZ 100-25 MG T: 100-25 | 90 days supply | Qty: 90 | Fill #1

## 2018-03-23 MED FILL — BENAZEPRIL HCL 40 MG TABLET: 40 | 90 days supply | Qty: 90 | Fill #1

## 2018-03-23 MED FILL — AMLODIPINE BESYLATE 10 MG T: 10 | 90 days supply | Qty: 90 | Fill #1

## 2018-03-23 MED FILL — PRAVASTATIN NA 40 MG TAB: 40 | 90 days supply | Qty: 90 | Fill #1

## 2018-03-31 DIAGNOSIS — Z0000 Encounter for general adult medical examination without abnormal findings: Secondary | ICD-10-CM | POA: Diagnosis not present

## 2018-03-31 DIAGNOSIS — G459 Transient cerebral ischemic attack, unspecified: Secondary | ICD-10-CM | POA: Diagnosis not present

## 2018-03-31 DIAGNOSIS — Z125 Encounter for screening for malignant neoplasm of prostate: Secondary | ICD-10-CM | POA: Diagnosis not present

## 2018-03-31 DIAGNOSIS — E7800 Pure hypercholesterolemia, unspecified: Secondary | ICD-10-CM | POA: Diagnosis not present

## 2018-03-31 DIAGNOSIS — I10 Essential (primary) hypertension: Secondary | ICD-10-CM | POA: Diagnosis not present

## 2018-03-31 DIAGNOSIS — R739 Hyperglycemia, unspecified: Secondary | ICD-10-CM | POA: Diagnosis not present

## 2018-03-31 DIAGNOSIS — F17210 Nicotine dependence, cigarettes, uncomplicated: Secondary | ICD-10-CM | POA: Diagnosis not present

## 2018-05-06 MED FILL — ESCITALOPRAM 10 MG TABLET: 10 | 30 days supply | Qty: 30 | Fill #0

## 2018-05-06 MED FILL — ESCITALOPRAM 20 MG TABLET: 20 | 30 days supply | Qty: 30 | Fill #0

## 2018-05-20 MED FILL — LATANOPROST 0.005% EYE DRP: 0.005 | 75 days supply | Qty: 8 | Fill #2

## 2018-05-28 MED FILL — OMEPRAZOLE 20 MG CPDR: 20 | 90 days supply | Qty: 90 | Fill #3

## 2018-06-24 MED FILL — PRAVASTATIN NA 40 MG TAB: 40 | 90 days supply | Qty: 90 | Fill #2

## 2018-06-24 MED FILL — BENAZEPRIL HCL 40 MG TABLET: 40 | 90 days supply | Qty: 90 | Fill #2

## 2018-06-24 MED FILL — METOPROLOL-HCTZ 100-25 MG T: 100-25 | 90 days supply | Qty: 90 | Fill #2

## 2018-06-24 MED FILL — AMLODIPINE BESYLATE 10 MG T: 10 | 90 days supply | Qty: 90 | Fill #2

## 2018-07-28 MED FILL — LATANOPROST 0.005% EYE DRP: 0.005 | 75 days supply | Qty: 8 | Fill #3

## 2018-08-10 MED FILL — OMEPRAZOLE 20 MG CPDR: 20 | 90 days supply | Qty: 90 | Fill #4

## 2018-10-01 DIAGNOSIS — E7800 Pure hypercholesterolemia, unspecified: Secondary | ICD-10-CM | POA: Diagnosis not present

## 2018-10-01 DIAGNOSIS — F419 Anxiety disorder, unspecified: Secondary | ICD-10-CM | POA: Diagnosis not present

## 2018-10-01 DIAGNOSIS — G459 Transient cerebral ischemic attack, unspecified: Secondary | ICD-10-CM | POA: Diagnosis not present

## 2018-10-01 DIAGNOSIS — F17210 Nicotine dependence, cigarettes, uncomplicated: Secondary | ICD-10-CM | POA: Diagnosis not present

## 2018-10-01 DIAGNOSIS — R7309 Other abnormal glucose: Secondary | ICD-10-CM | POA: Diagnosis not present

## 2018-10-01 DIAGNOSIS — I10 Essential (primary) hypertension: Secondary | ICD-10-CM | POA: Diagnosis not present

## 2018-11-06 DIAGNOSIS — H402232 Chronic angle-closure glaucoma, bilateral, moderate stage: Secondary | ICD-10-CM | POA: Diagnosis not present

## 2018-11-06 DIAGNOSIS — E119 Type 2 diabetes mellitus without complications: Secondary | ICD-10-CM | POA: Diagnosis not present

## 2018-11-23 DIAGNOSIS — L539 Erythematous condition, unspecified: Secondary | ICD-10-CM | POA: Diagnosis not present

## 2018-11-23 DIAGNOSIS — W57XXXA Bitten or stung by nonvenomous insect and other nonvenomous arthropods, initial encounter: Secondary | ICD-10-CM | POA: Diagnosis not present

## 2019-03-29 ENCOUNTER — Other Ambulatory Visit: Payer: Self-pay | Admitting: Internal Medicine

## 2019-03-29 DIAGNOSIS — R42 Dizziness and giddiness: Secondary | ICD-10-CM | POA: Diagnosis not present

## 2019-03-29 DIAGNOSIS — Z8673 Personal history of transient ischemic attack (TIA), and cerebral infarction without residual deficits: Secondary | ICD-10-CM | POA: Diagnosis not present

## 2019-03-29 DIAGNOSIS — I10 Essential (primary) hypertension: Secondary | ICD-10-CM | POA: Diagnosis not present

## 2019-03-30 ENCOUNTER — Ambulatory Visit
Admission: RE | Admit: 2019-03-30 | Discharge: 2019-03-30 | Disposition: A | Discharge status: Home / Self Care | Payer: Medicare Other | Source: Ambulatory Visit | Attending: Internal Medicine | Admitting: Internal Medicine

## 2019-03-30 ENCOUNTER — Other Ambulatory Visit: Payer: Self-pay

## 2019-03-30 DIAGNOSIS — R42 Dizziness and giddiness: Secondary | ICD-10-CM | POA: Diagnosis not present

## 2019-04-02 DIAGNOSIS — I10 Essential (primary) hypertension: Secondary | ICD-10-CM | POA: Diagnosis not present

## 2019-04-02 DIAGNOSIS — E1169 Type 2 diabetes mellitus with other specified complication: Secondary | ICD-10-CM | POA: Diagnosis not present

## 2019-04-02 DIAGNOSIS — Z125 Encounter for screening for malignant neoplasm of prostate: Secondary | ICD-10-CM | POA: Diagnosis not present

## 2019-04-02 DIAGNOSIS — Z23 Encounter for immunization: Secondary | ICD-10-CM | POA: Diagnosis not present

## 2019-04-02 DIAGNOSIS — E7800 Pure hypercholesterolemia, unspecified: Secondary | ICD-10-CM | POA: Diagnosis not present

## 2019-04-02 DIAGNOSIS — G459 Transient cerebral ischemic attack, unspecified: Secondary | ICD-10-CM | POA: Diagnosis not present

## 2019-04-02 DIAGNOSIS — F17210 Nicotine dependence, cigarettes, uncomplicated: Secondary | ICD-10-CM | POA: Diagnosis not present

## 2019-04-02 DIAGNOSIS — F419 Anxiety disorder, unspecified: Secondary | ICD-10-CM | POA: Diagnosis not present

## 2019-04-02 DIAGNOSIS — Z0000 Encounter for general adult medical examination without abnormal findings: Secondary | ICD-10-CM | POA: Diagnosis not present

## 2019-09-28 DIAGNOSIS — G459 Transient cerebral ischemic attack, unspecified: Secondary | ICD-10-CM | POA: Diagnosis not present

## 2019-09-28 DIAGNOSIS — I10 Essential (primary) hypertension: Secondary | ICD-10-CM | POA: Diagnosis not present

## 2019-09-28 DIAGNOSIS — E1169 Type 2 diabetes mellitus with other specified complication: Secondary | ICD-10-CM | POA: Diagnosis not present

## 2019-09-28 DIAGNOSIS — F419 Anxiety disorder, unspecified: Secondary | ICD-10-CM | POA: Diagnosis not present

## 2019-09-28 DIAGNOSIS — F17210 Nicotine dependence, cigarettes, uncomplicated: Secondary | ICD-10-CM | POA: Diagnosis not present

## 2019-09-28 DIAGNOSIS — Z23 Encounter for immunization: Secondary | ICD-10-CM | POA: Diagnosis not present

## 2019-09-28 DIAGNOSIS — E7800 Pure hypercholesterolemia, unspecified: Secondary | ICD-10-CM | POA: Diagnosis not present

## 2020-04-06 DIAGNOSIS — E1169 Type 2 diabetes mellitus with other specified complication: Secondary | ICD-10-CM | POA: Diagnosis not present

## 2020-04-06 DIAGNOSIS — E7800 Pure hypercholesterolemia, unspecified: Secondary | ICD-10-CM | POA: Diagnosis not present

## 2020-04-06 DIAGNOSIS — F17210 Nicotine dependence, cigarettes, uncomplicated: Secondary | ICD-10-CM | POA: Diagnosis not present

## 2020-04-06 DIAGNOSIS — G459 Transient cerebral ischemic attack, unspecified: Secondary | ICD-10-CM | POA: Diagnosis not present

## 2020-04-06 DIAGNOSIS — I10 Essential (primary) hypertension: Secondary | ICD-10-CM | POA: Diagnosis not present

## 2020-04-06 DIAGNOSIS — K649 Unspecified hemorrhoids: Secondary | ICD-10-CM | POA: Diagnosis not present

## 2020-04-06 DIAGNOSIS — F419 Anxiety disorder, unspecified: Secondary | ICD-10-CM | POA: Diagnosis not present

## 2020-04-06 DIAGNOSIS — Z1211 Encounter for screening for malignant neoplasm of colon: Secondary | ICD-10-CM | POA: Diagnosis not present

## 2020-04-06 DIAGNOSIS — Z7984 Long term (current) use of oral hypoglycemic drugs: Secondary | ICD-10-CM | POA: Diagnosis not present

## 2020-04-06 DIAGNOSIS — Z125 Encounter for screening for malignant neoplasm of prostate: Secondary | ICD-10-CM | POA: Diagnosis not present

## 2020-04-06 DIAGNOSIS — Z0000 Encounter for general adult medical examination without abnormal findings: Secondary | ICD-10-CM | POA: Diagnosis not present

## 2020-04-11 DIAGNOSIS — Z1211 Encounter for screening for malignant neoplasm of colon: Secondary | ICD-10-CM | POA: Diagnosis not present

## 2020-08-24 DIAGNOSIS — Z23 Encounter for immunization: Secondary | ICD-10-CM | POA: Diagnosis not present

## 2020-10-05 DIAGNOSIS — Z7984 Long term (current) use of oral hypoglycemic drugs: Secondary | ICD-10-CM | POA: Diagnosis not present

## 2020-10-05 DIAGNOSIS — F419 Anxiety disorder, unspecified: Secondary | ICD-10-CM | POA: Diagnosis not present

## 2020-10-05 DIAGNOSIS — I10 Essential (primary) hypertension: Secondary | ICD-10-CM | POA: Diagnosis not present

## 2020-10-05 DIAGNOSIS — F17210 Nicotine dependence, cigarettes, uncomplicated: Secondary | ICD-10-CM | POA: Diagnosis not present

## 2020-10-05 DIAGNOSIS — E7800 Pure hypercholesterolemia, unspecified: Secondary | ICD-10-CM | POA: Diagnosis not present

## 2020-10-05 DIAGNOSIS — E1169 Type 2 diabetes mellitus with other specified complication: Secondary | ICD-10-CM | POA: Diagnosis not present

## 2021-01-14 IMAGING — CT CT HEAD WITHOUT CONTRAST
3 of 4 series · 16 of 47 positions shown, 19 images · non-contrast
Comparison: CT brain noncontrast 10/08/2014

CLINICAL DATA: Dizziness over a week History TIA, htn on meds No
blurred vision No surgery to head No trauma or injury No cancer

EXAM:
CT HEAD WITHOUT CONTRAST
TECHNIQUE: Contiguous axial images were obtained from the base of the skull
through the vertex without intravenous contrast.

[Series 2: head 5.00 hr40 s3 axial ibhc · axial · 0.45mm/px · z∈[-579,-434]mm · 10 of 35 slices shown, 13 images]
[im 3/35  brain]
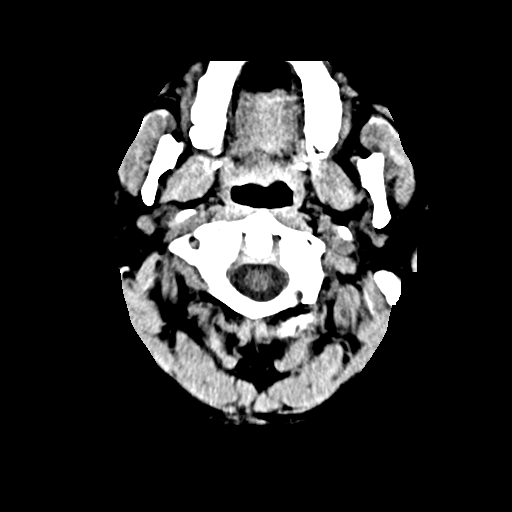
[im 3/35  bone]
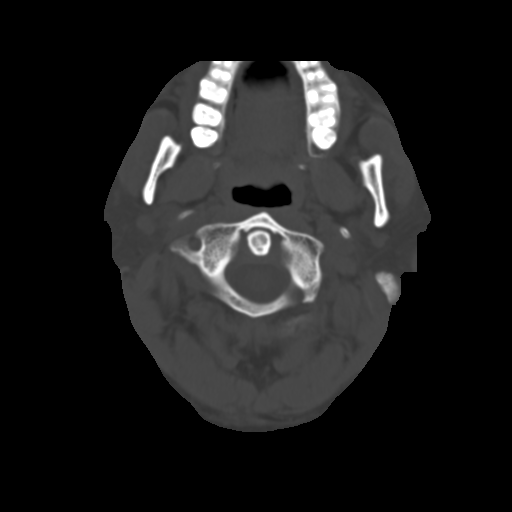
[im 5/35  brain]
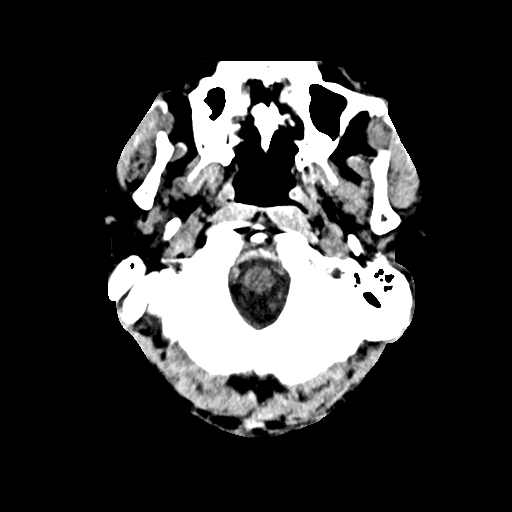
[im 10/35  brain]
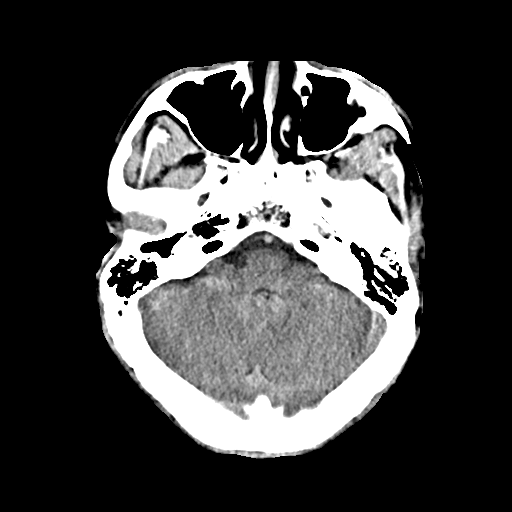
[im 13/35  brain]
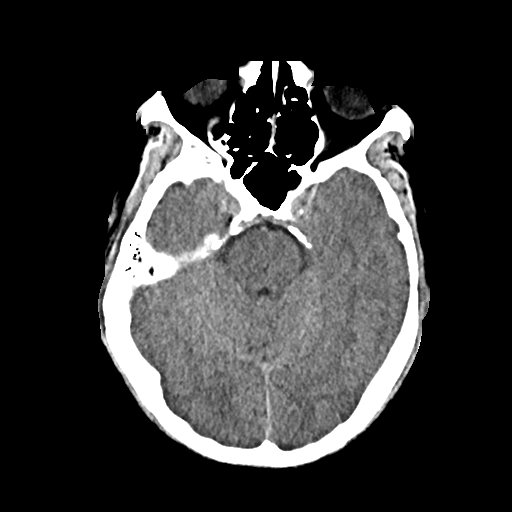
[im 15/35  brain]
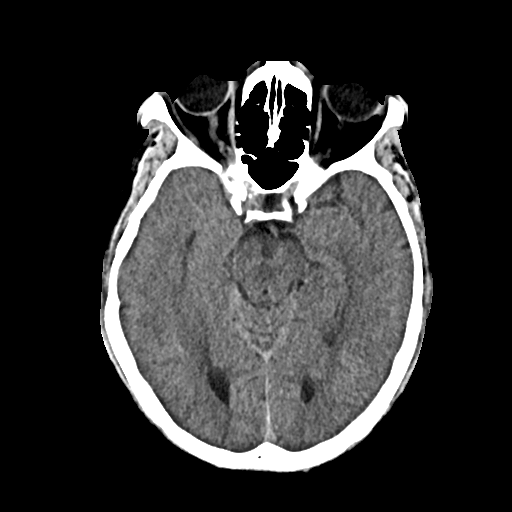
[im 15/35  bone]
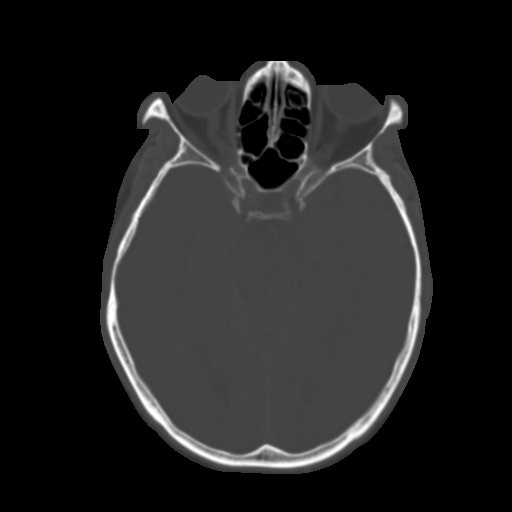
[im 20/35  brain]
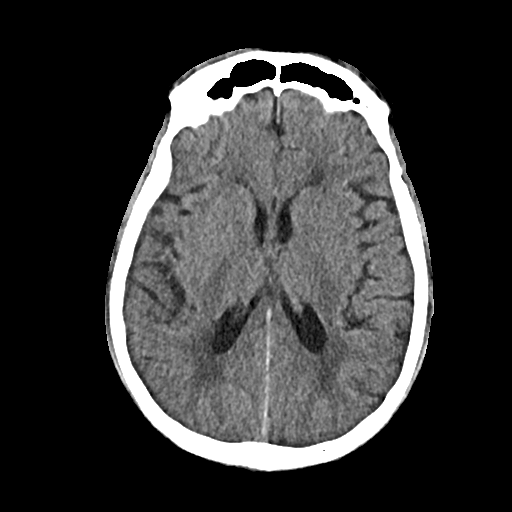
[im 22/35  brain]
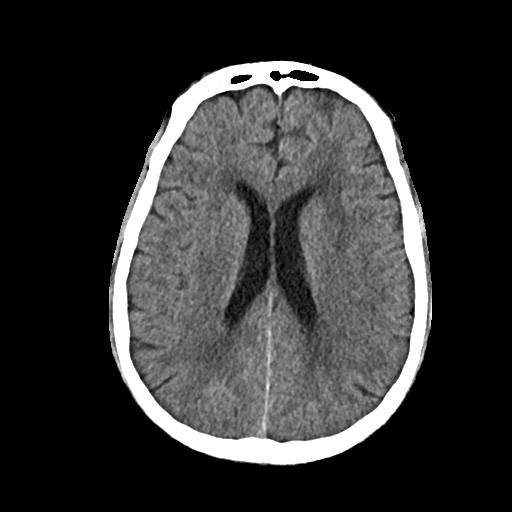
[im 25/35  brain]
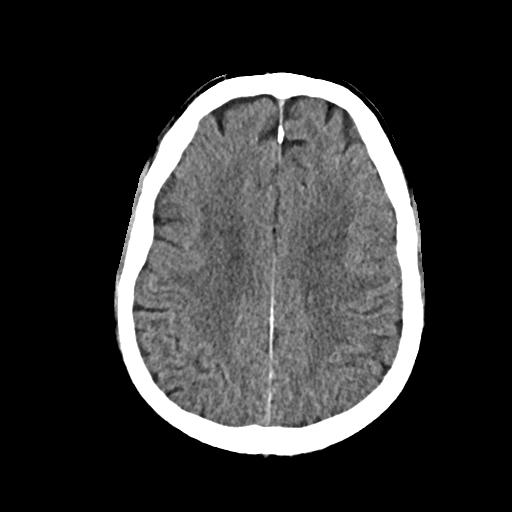
[im 30/35  brain]
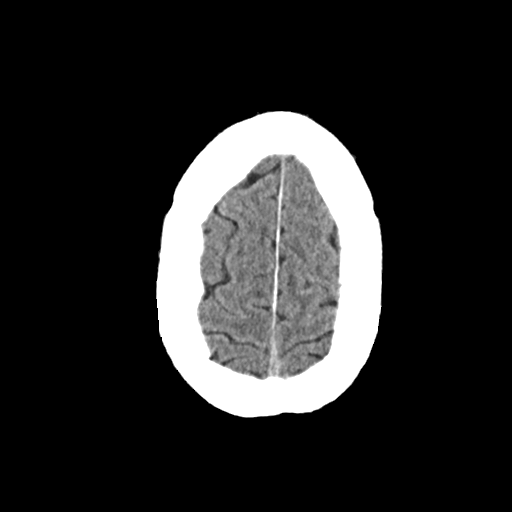
[im 30/35  bone]
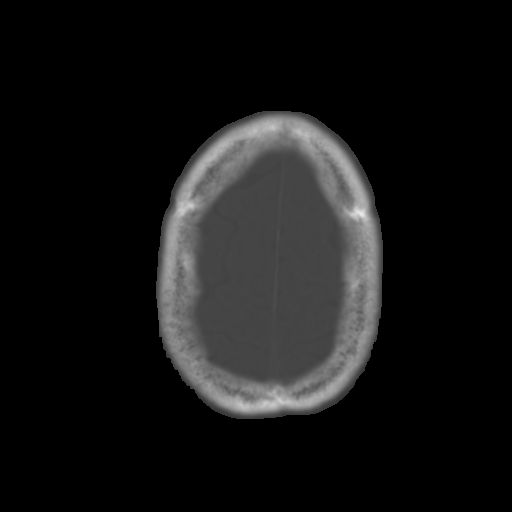
[im 32/35  brain]
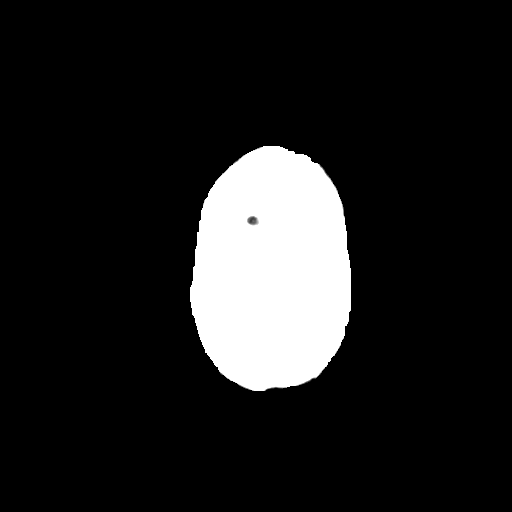

[Series 4: head 3.00 hr40 s3 sag · sagittal · 0.34mm/px · 3 of 62 slices shown]
[im 21/62  brain]
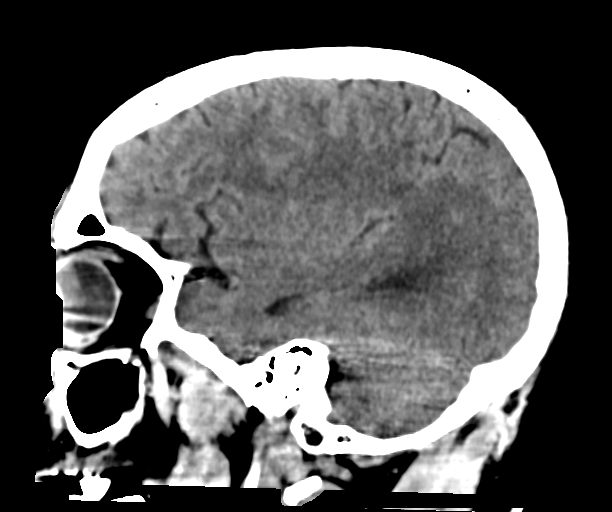
[im 31/62  brain]
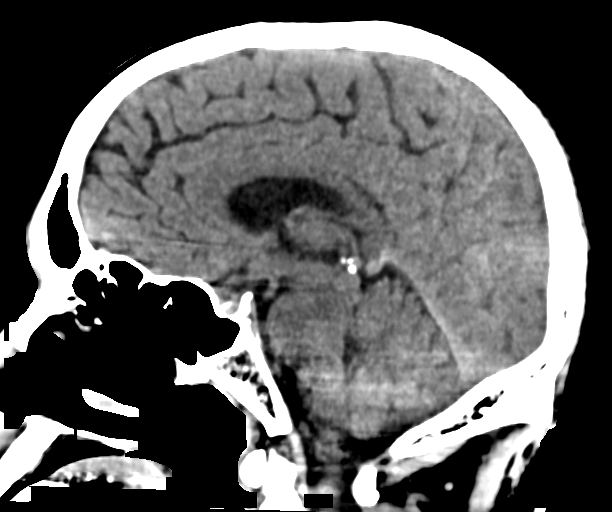
[im 41/62  brain]
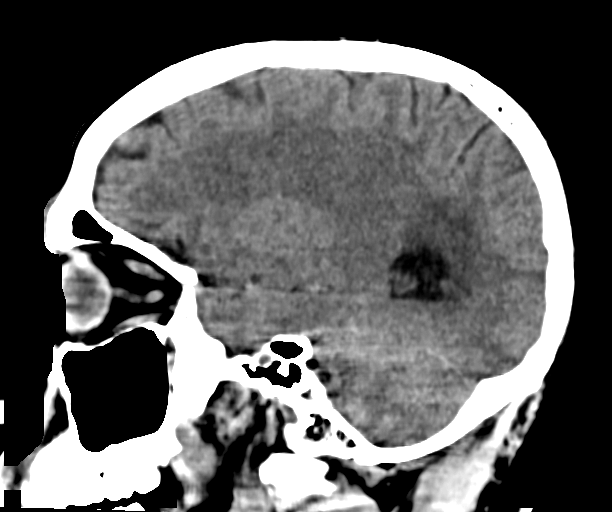

[Series 6: head 3.00 hr40 s3 cor · coronal · 0.34mm/px · 3 of 69 slices shown]
[im 23/69  brain]
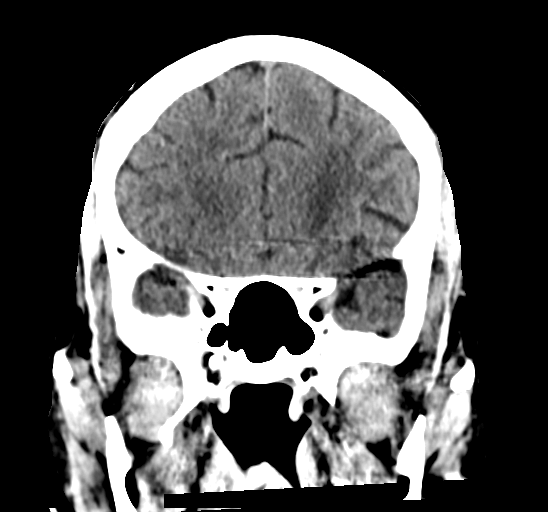
[im 31/69  brain]
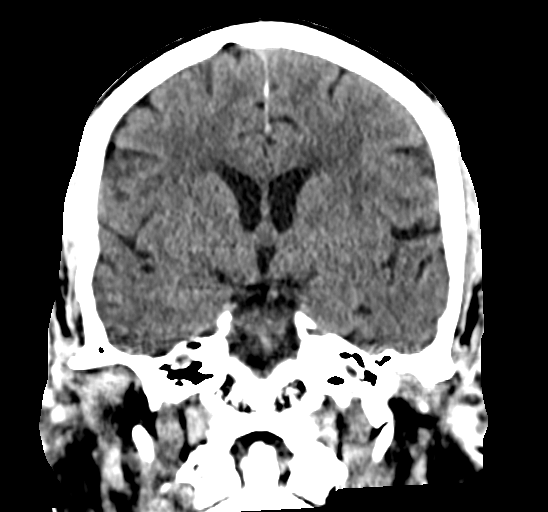
[im 38/69  brain]
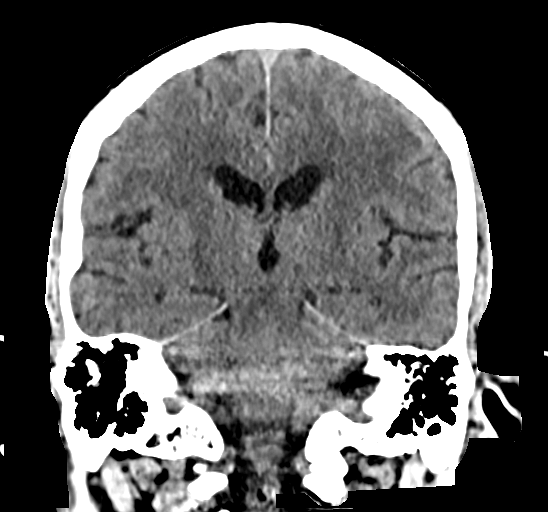

[16 of 47 positions shown; findings below may reference images not displayed]

FINDINGS: Brain: No evidence of acute infarction, hemorrhage, hydrocephalus,
extra-axial collection or mass lesion/mass effect. Stable
periventricular chronic small vessel ischemic changes.

Vascular: No hyperdense vessel or unexpected calcification.

Skull: Normal. Negative for fracture or focal lesion.

Sinuses/Orbits: No acute finding.

Other: None.
IMPRESSION: No evidence of acute intracranial abnormality.

## 2021-03-15 DIAGNOSIS — Z20822 Contact with and (suspected) exposure to covid-19: Secondary | ICD-10-CM | POA: Diagnosis not present

## 2021-04-09 DIAGNOSIS — I10 Essential (primary) hypertension: Secondary | ICD-10-CM | POA: Diagnosis not present

## 2021-04-09 DIAGNOSIS — Z125 Encounter for screening for malignant neoplasm of prostate: Secondary | ICD-10-CM | POA: Diagnosis not present

## 2021-04-09 DIAGNOSIS — Z1211 Encounter for screening for malignant neoplasm of colon: Secondary | ICD-10-CM | POA: Diagnosis not present

## 2021-04-09 DIAGNOSIS — Z0000 Encounter for general adult medical examination without abnormal findings: Secondary | ICD-10-CM | POA: Diagnosis not present

## 2021-04-09 DIAGNOSIS — F419 Anxiety disorder, unspecified: Secondary | ICD-10-CM | POA: Diagnosis not present

## 2021-04-09 DIAGNOSIS — E1169 Type 2 diabetes mellitus with other specified complication: Secondary | ICD-10-CM | POA: Diagnosis not present

## 2021-04-09 DIAGNOSIS — E7800 Pure hypercholesterolemia, unspecified: Secondary | ICD-10-CM | POA: Diagnosis not present

## 2021-04-09 DIAGNOSIS — Z7984 Long term (current) use of oral hypoglycemic drugs: Secondary | ICD-10-CM | POA: Diagnosis not present

## 2021-04-09 DIAGNOSIS — G459 Transient cerebral ischemic attack, unspecified: Secondary | ICD-10-CM | POA: Diagnosis not present

## 2021-04-20 DIAGNOSIS — Z1211 Encounter for screening for malignant neoplasm of colon: Secondary | ICD-10-CM | POA: Diagnosis not present

## 2021-08-02 DIAGNOSIS — Z9889 Other specified postprocedural states: Secondary | ICD-10-CM | POA: Diagnosis not present

## 2021-08-02 DIAGNOSIS — E119 Type 2 diabetes mellitus without complications: Secondary | ICD-10-CM | POA: Diagnosis not present

## 2021-08-02 DIAGNOSIS — H35411 Lattice degeneration of retina, right eye: Secondary | ICD-10-CM | POA: Diagnosis not present

## 2021-08-02 DIAGNOSIS — H2513 Age-related nuclear cataract, bilateral: Secondary | ICD-10-CM | POA: Diagnosis not present

## 2021-08-02 DIAGNOSIS — H11152 Pinguecula, left eye: Secondary | ICD-10-CM | POA: Diagnosis not present

## 2021-08-02 DIAGNOSIS — H402231 Chronic angle-closure glaucoma, bilateral, mild stage: Secondary | ICD-10-CM | POA: Diagnosis not present

## 2021-10-18 DIAGNOSIS — Z7984 Long term (current) use of oral hypoglycemic drugs: Secondary | ICD-10-CM | POA: Diagnosis not present

## 2021-10-18 DIAGNOSIS — E1169 Type 2 diabetes mellitus with other specified complication: Secondary | ICD-10-CM | POA: Diagnosis not present

## 2021-10-18 DIAGNOSIS — E7800 Pure hypercholesterolemia, unspecified: Secondary | ICD-10-CM | POA: Diagnosis not present

## 2021-10-18 DIAGNOSIS — I10 Essential (primary) hypertension: Secondary | ICD-10-CM | POA: Diagnosis not present

## 2021-10-18 DIAGNOSIS — F419 Anxiety disorder, unspecified: Secondary | ICD-10-CM | POA: Diagnosis not present

## 2022-07-24 DIAGNOSIS — I10 Essential (primary) hypertension: Secondary | ICD-10-CM | POA: Diagnosis not present

## 2022-07-24 DIAGNOSIS — E7800 Pure hypercholesterolemia, unspecified: Secondary | ICD-10-CM | POA: Diagnosis not present

## 2022-07-24 DIAGNOSIS — F17210 Nicotine dependence, cigarettes, uncomplicated: Secondary | ICD-10-CM | POA: Diagnosis not present

## 2022-07-24 DIAGNOSIS — G459 Transient cerebral ischemic attack, unspecified: Secondary | ICD-10-CM | POA: Diagnosis not present

## 2022-07-24 DIAGNOSIS — Z79899 Other long term (current) drug therapy: Secondary | ICD-10-CM | POA: Diagnosis not present

## 2022-07-24 DIAGNOSIS — E1169 Type 2 diabetes mellitus with other specified complication: Secondary | ICD-10-CM | POA: Diagnosis not present

## 2022-07-24 DIAGNOSIS — Z5181 Encounter for therapeutic drug level monitoring: Secondary | ICD-10-CM | POA: Diagnosis not present

## 2022-07-24 DIAGNOSIS — Z0000 Encounter for general adult medical examination without abnormal findings: Secondary | ICD-10-CM | POA: Diagnosis not present

## 2023-04-14 DIAGNOSIS — H11152 Pinguecula, left eye: Secondary | ICD-10-CM | POA: Diagnosis not present

## 2023-04-14 DIAGNOSIS — E119 Type 2 diabetes mellitus without complications: Secondary | ICD-10-CM | POA: Diagnosis not present

## 2023-04-14 DIAGNOSIS — H402231 Chronic angle-closure glaucoma, bilateral, mild stage: Secondary | ICD-10-CM | POA: Diagnosis not present

## 2023-04-14 DIAGNOSIS — H2513 Age-related nuclear cataract, bilateral: Secondary | ICD-10-CM | POA: Diagnosis not present

## 2023-04-14 DIAGNOSIS — H35411 Lattice degeneration of retina, right eye: Secondary | ICD-10-CM | POA: Diagnosis not present

## 2023-08-14 DIAGNOSIS — Z23 Encounter for immunization: Secondary | ICD-10-CM | POA: Diagnosis not present

## 2023-08-14 DIAGNOSIS — G459 Transient cerebral ischemic attack, unspecified: Secondary | ICD-10-CM | POA: Diagnosis not present

## 2023-08-14 DIAGNOSIS — Z1211 Encounter for screening for malignant neoplasm of colon: Secondary | ICD-10-CM | POA: Diagnosis not present

## 2023-08-14 DIAGNOSIS — E1169 Type 2 diabetes mellitus with other specified complication: Secondary | ICD-10-CM | POA: Diagnosis not present

## 2023-08-14 DIAGNOSIS — E7800 Pure hypercholesterolemia, unspecified: Secondary | ICD-10-CM | POA: Diagnosis not present

## 2023-08-14 DIAGNOSIS — E119 Type 2 diabetes mellitus without complications: Secondary | ICD-10-CM | POA: Diagnosis not present

## 2023-08-14 DIAGNOSIS — I10 Essential (primary) hypertension: Secondary | ICD-10-CM | POA: Diagnosis not present

## 2023-08-14 DIAGNOSIS — Z0000 Encounter for general adult medical examination without abnormal findings: Secondary | ICD-10-CM | POA: Diagnosis not present

## 2024-01-22 DIAGNOSIS — R5381 Other malaise: Secondary | ICD-10-CM | POA: Diagnosis not present

## 2024-01-22 DIAGNOSIS — W57XXXA Bitten or stung by nonvenomous insect and other nonvenomous arthropods, initial encounter: Secondary | ICD-10-CM | POA: Diagnosis not present

## 2024-02-12 DIAGNOSIS — F17210 Nicotine dependence, cigarettes, uncomplicated: Secondary | ICD-10-CM | POA: Diagnosis not present

## 2024-02-12 DIAGNOSIS — E7800 Pure hypercholesterolemia, unspecified: Secondary | ICD-10-CM | POA: Diagnosis not present

## 2024-02-12 DIAGNOSIS — I10 Essential (primary) hypertension: Secondary | ICD-10-CM | POA: Diagnosis not present

## 2024-02-12 DIAGNOSIS — G459 Transient cerebral ischemic attack, unspecified: Secondary | ICD-10-CM | POA: Diagnosis not present

## 2024-02-12 DIAGNOSIS — E1169 Type 2 diabetes mellitus with other specified complication: Secondary | ICD-10-CM | POA: Diagnosis not present

## 2024-02-12 DIAGNOSIS — Z5181 Encounter for therapeutic drug level monitoring: Secondary | ICD-10-CM | POA: Diagnosis not present

## 2024-04-01 DIAGNOSIS — E1169 Type 2 diabetes mellitus with other specified complication: Secondary | ICD-10-CM | POA: Diagnosis not present

## 2024-04-01 DIAGNOSIS — G459 Transient cerebral ischemic attack, unspecified: Secondary | ICD-10-CM | POA: Diagnosis not present

## 2024-04-01 DIAGNOSIS — I10 Essential (primary) hypertension: Secondary | ICD-10-CM | POA: Diagnosis not present

## 2024-04-01 DIAGNOSIS — E7800 Pure hypercholesterolemia, unspecified: Secondary | ICD-10-CM | POA: Diagnosis not present

## 2024-05-02 DIAGNOSIS — G459 Transient cerebral ischemic attack, unspecified: Secondary | ICD-10-CM | POA: Diagnosis not present

## 2024-05-02 DIAGNOSIS — I10 Essential (primary) hypertension: Secondary | ICD-10-CM | POA: Diagnosis not present

## 2024-05-02 DIAGNOSIS — E1169 Type 2 diabetes mellitus with other specified complication: Secondary | ICD-10-CM | POA: Diagnosis not present

## 2024-05-02 DIAGNOSIS — E7800 Pure hypercholesterolemia, unspecified: Secondary | ICD-10-CM | POA: Diagnosis not present

## 2024-06-01 DIAGNOSIS — I10 Essential (primary) hypertension: Secondary | ICD-10-CM | POA: Diagnosis not present

## 2024-06-01 DIAGNOSIS — E1169 Type 2 diabetes mellitus with other specified complication: Secondary | ICD-10-CM | POA: Diagnosis not present

## 2024-06-01 DIAGNOSIS — G459 Transient cerebral ischemic attack, unspecified: Secondary | ICD-10-CM | POA: Diagnosis not present

## 2024-06-01 DIAGNOSIS — E7800 Pure hypercholesterolemia, unspecified: Secondary | ICD-10-CM | POA: Diagnosis not present

## 2024-06-23 DIAGNOSIS — H2513 Age-related nuclear cataract, bilateral: Secondary | ICD-10-CM | POA: Diagnosis not present

## 2024-06-23 DIAGNOSIS — E119 Type 2 diabetes mellitus without complications: Secondary | ICD-10-CM | POA: Diagnosis not present

## 2024-06-23 DIAGNOSIS — H402231 Chronic angle-closure glaucoma, bilateral, mild stage: Secondary | ICD-10-CM | POA: Diagnosis not present

## 2024-06-23 DIAGNOSIS — H35411 Lattice degeneration of retina, right eye: Secondary | ICD-10-CM | POA: Diagnosis not present

## 2024-06-23 DIAGNOSIS — H11152 Pinguecula, left eye: Secondary | ICD-10-CM | POA: Diagnosis not present
